# Patient Record
Sex: Female | Born: 1960 | Race: White | Hispanic: No | Smoking: Never smoker
Health system: Southern US, Community
[De-identification: ages and names within clinical notes are randomized; demographics above are authoritative.]

## PROBLEM LIST (undated history)

## (undated) DIAGNOSIS — IMO0001 Reserved for inherently not codable concepts without codable children: Secondary | ICD-10-CM

## (undated) DIAGNOSIS — B009 Herpesviral infection, unspecified: Secondary | ICD-10-CM

## (undated) DIAGNOSIS — E039 Hypothyroidism, unspecified: Secondary | ICD-10-CM

## (undated) DIAGNOSIS — IMO0002 Reserved for concepts with insufficient information to code with codable children: Secondary | ICD-10-CM

## (undated) DIAGNOSIS — F329 Major depressive disorder, single episode, unspecified: Secondary | ICD-10-CM

## (undated) DIAGNOSIS — F419 Anxiety disorder, unspecified: Secondary | ICD-10-CM

## (undated) DIAGNOSIS — I341 Nonrheumatic mitral (valve) prolapse: Secondary | ICD-10-CM

## (undated) DIAGNOSIS — Z8669 Personal history of other diseases of the nervous system and sense organs: Secondary | ICD-10-CM

## (undated) DIAGNOSIS — M1990 Unspecified osteoarthritis, unspecified site: Secondary | ICD-10-CM

## (undated) DIAGNOSIS — F32A Depression, unspecified: Secondary | ICD-10-CM

## (undated) DIAGNOSIS — Z87898 Personal history of other specified conditions: Secondary | ICD-10-CM

## (undated) HISTORY — DX: Personal history of other specified conditions: Z87.898

## (undated) HISTORY — DX: Unspecified osteoarthritis, unspecified site: M19.90

## (undated) HISTORY — DX: Reserved for inherently not codable concepts without codable children: IMO0001

## (undated) HISTORY — DX: Reserved for concepts with insufficient information to code with codable children: IMO0002

## (undated) HISTORY — PX: AUGMENTATION MAMMAPLASTY: SUR837

## (undated) HISTORY — DX: Major depressive disorder, single episode, unspecified: F32.9

## (undated) HISTORY — PX: BREAST EXCISIONAL BIOPSY: SUR124

## (undated) HISTORY — DX: Anxiety disorder, unspecified: F41.9

## (undated) HISTORY — DX: Herpesviral infection, unspecified: B00.9

## (undated) HISTORY — DX: Nonrheumatic mitral (valve) prolapse: I34.1

## (undated) HISTORY — DX: Personal history of other diseases of the nervous system and sense organs: Z86.69

## (undated) HISTORY — DX: Depression, unspecified: F32.A

## (undated) HISTORY — PX: WISDOM TOOTH EXTRACTION: SHX21

## (undated) HISTORY — DX: Hypothyroidism, unspecified: E03.9

---

## 1991-12-04 HISTORY — PX: BREAST ENHANCEMENT SURGERY: SHX7

## 1999-12-04 HISTORY — PX: OTHER SURGICAL HISTORY: SHX169

## 2003-03-18 ENCOUNTER — Other Ambulatory Visit: Admission: RE | Admit: 2003-03-18 | Discharge: 2003-03-18 | Payer: Self-pay | Admitting: Internal Medicine

## 2003-11-13 ENCOUNTER — Emergency Department (HOSPITAL_COMMUNITY): Admission: EM | Admit: 2003-11-13 | Discharge: 2003-11-14 | Payer: Self-pay | Admitting: Emergency Medicine

## 2004-04-20 ENCOUNTER — Other Ambulatory Visit: Admission: RE | Admit: 2004-04-20 | Discharge: 2004-04-20 | Payer: Self-pay | Admitting: Obstetrics and Gynecology

## 2004-04-27 ENCOUNTER — Ambulatory Visit (HOSPITAL_COMMUNITY): Admission: RE | Admit: 2004-04-27 | Discharge: 2004-04-27 | Payer: Self-pay | Admitting: *Deleted

## 2004-12-03 HISTORY — PX: OTHER SURGICAL HISTORY: SHX169

## 2005-02-16 ENCOUNTER — Emergency Department (HOSPITAL_COMMUNITY): Admission: EM | Admit: 2005-02-16 | Discharge: 2005-02-16 | Payer: Self-pay | Admitting: Emergency Medicine

## 2005-04-24 ENCOUNTER — Other Ambulatory Visit: Admission: RE | Admit: 2005-04-24 | Discharge: 2005-04-24 | Payer: Self-pay | Admitting: *Deleted

## 2005-08-31 ENCOUNTER — Encounter: Admission: RE | Admit: 2005-08-31 | Discharge: 2005-08-31 | Payer: Self-pay | Admitting: *Deleted

## 2006-02-14 ENCOUNTER — Emergency Department (HOSPITAL_COMMUNITY): Admission: EM | Admit: 2006-02-14 | Discharge: 2006-02-14 | Payer: Self-pay | Admitting: Emergency Medicine

## 2006-02-25 ENCOUNTER — Emergency Department (HOSPITAL_COMMUNITY): Admission: EM | Admit: 2006-02-25 | Discharge: 2006-02-25 | Payer: Self-pay | Admitting: Emergency Medicine

## 2006-05-21 ENCOUNTER — Emergency Department (HOSPITAL_COMMUNITY): Admission: EM | Admit: 2006-05-21 | Discharge: 2006-05-21 | Payer: Self-pay | Admitting: Emergency Medicine

## 2006-08-15 ENCOUNTER — Encounter: Admission: RE | Admit: 2006-08-15 | Discharge: 2006-08-15 | Payer: Self-pay | Admitting: Obstetrics & Gynecology

## 2007-02-12 ENCOUNTER — Other Ambulatory Visit: Admission: RE | Admit: 2007-02-12 | Discharge: 2007-02-12 | Payer: Self-pay | Admitting: Obstetrics & Gynecology

## 2007-02-25 ENCOUNTER — Encounter: Admission: RE | Admit: 2007-02-25 | Discharge: 2007-02-25 | Payer: Self-pay | Admitting: Obstetrics and Gynecology

## 2007-06-03 HISTORY — PX: OTHER SURGICAL HISTORY: SHX169

## 2007-06-10 ENCOUNTER — Ambulatory Visit (HOSPITAL_BASED_OUTPATIENT_CLINIC_OR_DEPARTMENT_OTHER): Admission: RE | Admit: 2007-06-10 | Discharge: 2007-06-10 | Payer: Self-pay | Admitting: Obstetrics & Gynecology

## 2007-12-31 ENCOUNTER — Encounter: Admission: RE | Admit: 2007-12-31 | Discharge: 2007-12-31 | Payer: Self-pay | Admitting: Family Medicine

## 2008-04-08 ENCOUNTER — Other Ambulatory Visit: Admission: RE | Admit: 2008-04-08 | Discharge: 2008-04-08 | Payer: Self-pay | Admitting: Obstetrics and Gynecology

## 2008-05-13 ENCOUNTER — Encounter: Admission: RE | Admit: 2008-05-13 | Discharge: 2008-05-13 | Payer: Self-pay | Admitting: Obstetrics & Gynecology

## 2008-07-09 ENCOUNTER — Encounter: Admission: RE | Admit: 2008-07-09 | Discharge: 2008-07-09 | Payer: Self-pay | Admitting: Family Medicine

## 2008-07-30 ENCOUNTER — Encounter: Admission: RE | Admit: 2008-07-30 | Discharge: 2008-07-30 | Payer: Self-pay | Admitting: Family Medicine

## 2008-08-10 ENCOUNTER — Encounter: Admission: RE | Admit: 2008-08-10 | Discharge: 2008-09-01 | Payer: Self-pay | Admitting: Family Medicine

## 2008-08-25 ENCOUNTER — Encounter: Admission: RE | Admit: 2008-08-25 | Discharge: 2008-08-25 | Payer: Self-pay | Admitting: General Surgery

## 2009-03-11 ENCOUNTER — Encounter
Admission: RE | Admit: 2009-03-11 | Discharge: 2009-03-16 | Payer: Self-pay | Admitting: Physical Medicine & Rehabilitation

## 2009-03-16 ENCOUNTER — Ambulatory Visit: Payer: Self-pay | Admitting: Physical Medicine & Rehabilitation

## 2009-04-02 DIAGNOSIS — IMO0002 Reserved for concepts with insufficient information to code with codable children: Secondary | ICD-10-CM

## 2009-04-02 DIAGNOSIS — R87619 Unspecified abnormal cytological findings in specimens from cervix uteri: Secondary | ICD-10-CM

## 2009-04-02 HISTORY — DX: Unspecified abnormal cytological findings in specimens from cervix uteri: R87.619

## 2009-04-02 HISTORY — DX: Reserved for concepts with insufficient information to code with codable children: IMO0002

## 2009-04-12 ENCOUNTER — Other Ambulatory Visit: Admission: RE | Admit: 2009-04-12 | Discharge: 2009-04-12 | Payer: Self-pay | Admitting: Obstetrics and Gynecology

## 2009-04-28 ENCOUNTER — Ambulatory Visit: Payer: Self-pay | Admitting: Physical Medicine & Rehabilitation

## 2009-04-28 ENCOUNTER — Encounter
Admission: RE | Admit: 2009-04-28 | Discharge: 2009-07-27 | Payer: Self-pay | Admitting: Physical Medicine & Rehabilitation

## 2009-05-03 ENCOUNTER — Ambulatory Visit: Payer: Self-pay | Admitting: Physical Medicine & Rehabilitation

## 2009-05-03 ENCOUNTER — Encounter
Admission: RE | Admit: 2009-05-03 | Discharge: 2009-08-01 | Payer: Self-pay | Admitting: Physical Medicine & Rehabilitation

## 2009-05-16 ENCOUNTER — Encounter: Admission: RE | Admit: 2009-05-16 | Discharge: 2009-05-16 | Payer: Self-pay | Admitting: Obstetrics & Gynecology

## 2009-06-13 ENCOUNTER — Ambulatory Visit: Payer: Self-pay | Admitting: Physical Medicine & Rehabilitation

## 2009-06-23 ENCOUNTER — Ambulatory Visit: Payer: Self-pay | Admitting: Physical Medicine & Rehabilitation

## 2009-08-12 ENCOUNTER — Encounter
Admission: RE | Admit: 2009-08-12 | Discharge: 2009-11-07 | Payer: Self-pay | Admitting: Physical Medicine & Rehabilitation

## 2009-09-13 ENCOUNTER — Ambulatory Visit: Payer: Self-pay | Admitting: Physical Medicine & Rehabilitation

## 2009-11-07 ENCOUNTER — Encounter
Admission: RE | Admit: 2009-11-07 | Discharge: 2009-11-24 | Payer: Self-pay | Admitting: Physical Medicine & Rehabilitation

## 2009-11-08 ENCOUNTER — Ambulatory Visit: Payer: Self-pay | Admitting: Physical Medicine & Rehabilitation

## 2009-12-09 ENCOUNTER — Encounter
Admission: RE | Admit: 2009-12-09 | Discharge: 2010-03-09 | Payer: Self-pay | Admitting: Physical Medicine & Rehabilitation

## 2010-01-17 ENCOUNTER — Ambulatory Visit: Payer: Self-pay | Admitting: Physical Medicine & Rehabilitation

## 2010-03-10 ENCOUNTER — Encounter
Admission: RE | Admit: 2010-03-10 | Discharge: 2010-05-09 | Payer: Self-pay | Source: Home / Self Care | Admitting: Physical Medicine & Rehabilitation

## 2010-03-14 ENCOUNTER — Ambulatory Visit: Payer: Self-pay | Admitting: Physical Medicine & Rehabilitation

## 2010-05-09 ENCOUNTER — Ambulatory Visit: Payer: Self-pay | Admitting: Physical Medicine & Rehabilitation

## 2010-06-20 ENCOUNTER — Encounter: Admission: RE | Admit: 2010-06-20 | Discharge: 2010-06-20 | Payer: Self-pay | Admitting: Obstetrics & Gynecology

## 2010-06-23 ENCOUNTER — Encounter: Admission: RE | Admit: 2010-06-23 | Discharge: 2010-06-23 | Payer: Self-pay | Admitting: Obstetrics & Gynecology

## 2010-07-03 ENCOUNTER — Encounter
Admission: RE | Admit: 2010-07-03 | Discharge: 2010-07-10 | Payer: Self-pay | Admitting: Physical Medicine & Rehabilitation

## 2010-07-10 ENCOUNTER — Ambulatory Visit: Payer: Self-pay | Admitting: Physical Medicine & Rehabilitation

## 2010-09-04 ENCOUNTER — Encounter
Admission: RE | Admit: 2010-09-04 | Discharge: 2010-09-04 | Payer: Self-pay | Source: Home / Self Care | Attending: Physical Medicine & Rehabilitation | Admitting: Physical Medicine & Rehabilitation

## 2010-09-04 ENCOUNTER — Ambulatory Visit: Payer: Self-pay | Admitting: Physical Medicine & Rehabilitation

## 2010-12-04 ENCOUNTER — Encounter
Admission: RE | Admit: 2010-12-04 | Discharge: 2011-01-02 | Payer: Self-pay | Source: Home / Self Care | Attending: Physical Medicine & Rehabilitation | Admitting: Physical Medicine & Rehabilitation

## 2010-12-24 ENCOUNTER — Encounter: Payer: Self-pay | Admitting: Obstetrics & Gynecology

## 2010-12-25 ENCOUNTER — Encounter: Payer: Self-pay | Admitting: General Practice

## 2011-01-03 ENCOUNTER — Ambulatory Visit: Admit: 2011-01-03 | Payer: Self-pay | Admitting: Physical Medicine & Rehabilitation

## 2011-01-03 ENCOUNTER — Ambulatory Visit (HOSPITAL_BASED_OUTPATIENT_CLINIC_OR_DEPARTMENT_OTHER): Payer: BC Managed Care – PPO | Admitting: Physical Medicine & Rehabilitation

## 2011-01-03 ENCOUNTER — Ambulatory Visit: Payer: BC Managed Care – PPO | Attending: Physical Medicine & Rehabilitation

## 2011-01-03 DIAGNOSIS — M47817 Spondylosis without myelopathy or radiculopathy, lumbosacral region: Secondary | ICD-10-CM | POA: Insufficient documentation

## 2011-01-03 DIAGNOSIS — M2170 Unequal limb length (acquired), unspecified site: Secondary | ICD-10-CM | POA: Insufficient documentation

## 2011-01-03 DIAGNOSIS — Z79899 Other long term (current) drug therapy: Secondary | ICD-10-CM | POA: Insufficient documentation

## 2011-01-03 DIAGNOSIS — M5380 Other specified dorsopathies, site unspecified: Secondary | ICD-10-CM

## 2011-01-03 DIAGNOSIS — F341 Dysthymic disorder: Secondary | ICD-10-CM

## 2011-01-03 DIAGNOSIS — M5137 Other intervertebral disc degeneration, lumbosacral region: Secondary | ICD-10-CM

## 2011-02-12 ENCOUNTER — Ambulatory Visit: Payer: Self-pay | Admitting: Physical Medicine & Rehabilitation

## 2011-04-17 NOTE — Assessment & Plan Note (Signed)
Nancy Glenn is back regarding her back pain.  She had some relief with the  prednisone again, but gained weight.  We started Lidoderm patches and  resumed therapy.  She also reports numbness in her hands related to her  typing on the computer and occasionally she has woken up at night with  numb hands.  Rates her pain 8/10, described as constant and stabbing.  Pain interferes with general activity, relations with others, enjoyment  of life on a severe level.  Sleep is fair.   REVIEW OF SYSTEMS:  Notable for numbness.  Other pertinent positives are  above and full review is in the written health and history section of  the chart.   SOCIAL HISTORY:  The patient is divorced and has 2 college-age  daughters.   PHYSICAL EXAMINATION:  Blood pressure is 116/62, pulse is 92,  respiratory rate is 18.  She is sating 99% on room air.  The patient is  pleasant, alert, and oriented x3.  Weight is stable.  She continues to  have some pain over the mid to lower lumbar spine.  Seem to be worse  with lateral bending on either side.  She is able to touch her toes and  then had pain with extension back to a neutral position.  Frank facet  maneuvers were not as painful today.  Strength is 5/5.  Normal sensory  exam.  Palpation revealed tenderness along the lateral processes and  facets in the lower lumbar spine.  Heart is regular.  Chest is clear.  Abdomen is soft, nontender.   ASSESSMENT:  1. Persistent low back pain.  The patient with documented L4-L5      herniated disk with only minimal results after epidural injection.  2. Attention deficit hyperactivity disorder.  3. Potential facet-mediated pain.   PLAN:  1. I think it would be worthwhile to try medial branch blocks      bilaterally at L3-L4, L4-L5 to see what component of her pain is      related to this.  2. We will stop Flexeril, introduce Skelaxin 800 mg q.6 h. p.r.n.  3. Introduce Mobic 15 mg q.a.m.  We discussed effects and side effects   of medications today.  4. The patient needs to try to push and resume exercise as possible      going forward.  I often feel that her complaints without of      proportion to her physical exam quite frankly.  Anxiety seems to      continue to play a big role there.       Ranelle Oyster, M.D.  Electronically Signed     ZTS/MedQ  D:  06/13/2009 11:35:49  T:  06/14/2009 00:54:34  Job #:  [id number]

## 2011-04-17 NOTE — Procedures (Signed)
Nancy Glenn, MERGENTHALER               ACCOUNT NO.:  [id number]   MEDICAL RECORD NO.:  [id number]           PATIENT TYPE:   LOCATION:                                 FACILITY:   PHYSICIAN:  Erick Colace, M.D.DATE OF BIRTH:  04-27-1961   DATE OF PROCEDURE:  06/23/2009  DATE OF DISCHARGE:                               OPERATIVE REPORT   Ms. Clowdus is a 50 year old female with pain, mainly activity related in  her low back area.  The pain is only partial response to medication  management and other conservative care.  Her pain with activity is 7/10  and interferes with prolonged sitting or bending.  Informed consent was  obtained after describing the risks and benefits of the procedure with  the patient.  These include bleeding, bruising, infection, temporary,  and permanent paralysis.  She elects to proceed and has given written  consent.  The patient was placed prone on fluoroscopy table.  Betadine  prep, sterile drape, 25-gauge 1-1/2-inch needle was used to anesthetize  skin and subcu tissue with 1% lidocaine x2 mL.  Then, a 22-gauge 3-1/2-  inch spinal needle was inserted, first targeting left S1 SAP sacral ala  junction, bone contact made, and confirmed with lateral imaging.  Omnipaque 180 x 0.5 mL demonstrated no intravascular uptake.  Then 0.5  mL of solution containing 1 mL of 4 mg/mL dexamethasone and 2 mL of 2%  MPF lidocaine were injected.  Then, left L5 SAP transverse process  junction, targeted bone contact made.  Omnipaque 180 x 0.5 mL  demonstrated no intravascular uptake.  Then, 0.5 mL of dexamethasone  lidocaine solution was injected.  The left L4 SAP transverse process  junction, targeted bone contact made.  Omnipaque 180 under live fluoro  demonstrated no intravascular uptake.  Then 0.5 of the dexamethasone  lidocaine solution was injected.  Same procedure repeated on the right  side using same needle injectate and technique.  The patient tolerated  the procedure  well.  Pre- and post- injection vitals stable.  Post  injection instructions given.      Erick Colace, M.D.  Electronically Signed     AEK/MEDQ  D:  06/23/2009 09:38:29  T:  06/23/2009 23:59:48  Job:  [id number]

## 2011-04-17 NOTE — Procedures (Signed)
Nancy Glenn, Nancy Glenn               ACCOUNT NO.:  [id number]   MEDICAL RECORD NO.:  [id number]          PATIENT TYPE:  SPE   LOCATION:  [facility]                         FACILITY:  [facility]   PHYSICIAN:  Erick Colace, M.D.DATE OF BIRTH:  June 19, 1961   DATE OF PROCEDURE:  04/28/2009  DATE OF DISCHARGE:  04/12/2009                               OPERATIVE REPORT   PROCEDURE:  Left paramedian L4-5 translaminar lumbar epidural steroid  injection under fluoroscopic guidance.   INDICATION:  Lumbar radiculitis, left lower extremity greater than right  lower extremity, pain is only partially responsive to medication  management including narcotic analgesics and interfering with mobility.   Informed consent was obtained after describing risks and benefits of the  procedure with the patient.  These include bleeding, bruising, and  infection.  She elects to proceed and has given written consent.  The  patient was placed prone on fluoroscopy table.  Betadine prep, sterile  drape.  A 25-gauge 1-1/2-inch needle was used to anesthetize the skin  and subcu tissue with 1% lidocaine x4 mL into the subcutaneous and  intramuscular tissues.  Then a 18-gauge Tuohy needle was inserted under  fluoroscopic guidance, targeting the left paramedian trans-interlaminar  space L4-L5.  AP and lateral images were utilized under lateral imaging.  Loss-of-resistance technique was employed.  Positive loss-of-resistance  confirmed with live epidural injection of Omnipaque 180 x 2 mL,  demonstrated good epidural spread, followed by injection of 2 mL of 40  mg/mL Depo-Medrol and 2 mL of 1% MPF lidocaine.  The patient tolerated  the procedure well.  Pre -and post-injection vitals were stable.  Post-  injection instructions was given.      Erick Colace, M.D.  Electronically Signed     AEK/MEDQ  D:  04/28/2009 10:40:23  T:  04/29/2009 00:46:44  Job:  [id number]

## 2011-04-17 NOTE — Group Therapy Note (Signed)
Nancy Glenn is visiting [place] today with complaints of low back pain.  She  referred to [place] by Dr. Louanna Raw at Urgent Care.   HISTORY OF PRESENT ILLNESS:  This is a 50 year old white female with  really no past medical history who states that since July 2009, she has  had increasing low back pain and really over the last year or so, it has  become unbearable.  She has MRIs of her neck and back done in January of  last year, which are notable for spondylosis in lower cervical spine.  She has multilevel mild disk disease in the lumbar spine; however, at L4-  L5, there was a more pronounced central disk herniation that attended  the thecal sac.  No definite nerve root compression was demonstrated.  Apparently, she had these tests done, but no one ever frankly discussed  the results with her at least by her account.  She has MRI of her brain,  also from August 2009, which shows scattered subcentimeter foci of  increased signal in the subcortical white matter.  The patient's pain  was 8/10.  She described as a sharp, stabbing, constant aching.  Pain  really only resides in her low back, does not radiate into the legs or  feet at all.  It is worse with prolonged activity of any sort including  walking, bending, sitting in activity sometimes two.  She notes that if  she sits for long period of time, it is often difficult to stand backup  and if she is standing for a while it is often helpful to put her elbows  on the table for support.  She use some heat with PT, which helps  somewhat.  She has been seeing them from around a month.  She receives a  TENS unit prescribed by her family physician and this has provided some  relief as well.  She also reports history of migraine headaches, which  have not been too far off her norm.  If anything in fact maybe  improving.   MEDICATIONS:  The patient is taking multiple medications recently  including Phenergan 25, Demerol 50, etodolac 400, Soma 350,  hydrocodone  10/500, oxycodone prescribed by physician assistant, Chestine Spore.  She reports  only taking the hydrocodone recently and the other one has been more  remote meds and/or she is seizure went out of them.  She reports some  benefit with all these to a certain extent, but nothing in a substantial  way.   ALLERGIES:  SULFA.   REVIEW OF SYSTEMS:  Notable for spasms in the back, as well as  shoulders.  She reports some anxiety as well.  She reports decreased  activity in general.   SOCIAL HISTORY:  The patient is divorced, but has the youngest child  still at home.  She had been previously very active, working out in [employer], etc. until her back started acting up severely over the last few  months time.   FAMILY HISTORY:  Unremarkable.   PHYSICAL EXAMINATION:  VITAL SIGNS:  Blood pressure is 122/66, pulse is  96, respiratory rate 18.  She is sating 99% on room air.  The patient is  generally pleasant.  She is a bit anxious and when I walked in the room,  she was pacing the room.  Affect is generally appropriate otherwise.  Gait was stable.  Her coordination was good overall.  Reflexes are 2+.  Sensation was grossly intact.  Back was notable for  slight elevation of  the right hemipelvis and counterclockwise rotation.  She had tenderness  along the lumbar paraspinals approximately at L3 through L5.  Pain was  notable with flexion as I can only bend her to about 40 degrees before  pain set in.  She also had pain with extension and facet maneuvers right  more than left today.  Lateral bending cause pain either side as well as  rotation to a lesser extent.  Strength is 5/5 in both legs and normal  sensory function.  Straight leg testing was equivocal.  Luisa Hart test and  compression tests were negative.  I did measure her leg length and she  measured out approximately 0.25-inch longer on the right than the left.  No obvious scoliosis was seen.  Neurologically she was intact otherwise   with good insight, awareness, memory, etc.  HEART:  Regular.  CHEST:  Clear.  ABDOMEN:  Soft, nontender.  The patient is rather tall and appear to be fairly physical physically  fit.   ASSESSMENT:  1. Chronic low back pain.  Etiology appears to be more a diskogenic if      we go by her MRI, although exam is consistent with facet mediated      process versus myofascial process.  2. Anxiety.   PLAN:  1. We started on a prednisone taper beginning at 60 mg in tapering off      over approximately 2 weeks time.  2. We will begin scheduled Skelaxin 800 mg q.6 h to tolerance.  3. We will be refill oxycodone 10/325 one q.6 h p.r.n. #60 for now.  4. Urine drug screen was collected today.  5. We will send her for an MRI of the lumbar spine to reassess her      back.  Certainly, symptoms increased dramatically since her      incident in October.  I would favor pursuing injection at this      point, but would like to defer on that until we receive her MRI      report.  I think with appropriate treatment, we can reduce her pain      and get her back to her level of activity she is more accustomed      to.      Ranelle Oyster, M.D.  Electronically Signed     ZTS/MedQ  D:  03/16/2009 15:00:08  T:  03/17/2009 06:58:57  Job #:  [id number]   cc:   Louanna Raw  Fax: [phone]

## 2011-04-17 NOTE — Op Note (Signed)
NAMEHALEEMAH, Nancy Glenn               ACCOUNT NO.:  [id number]   MEDICAL RECORD NO.:  [id number]          PATIENT TYPE:  AMB   LOCATION:  [facility]                         FACILITY:  [facility]   PHYSICIAN:  M. Leda Quail, MD  DATE OF BIRTH:  1961/04/20   DATE OF PROCEDURE:  06/10/2007  DATE OF DISCHARGE:                               OPERATIVE REPORT   PREOPERATIVE DIAGNOSES:  5. A 50 year old, G 3, P 3 white female, with labial asymmetry and      labial hypertrophy, desires a labial reduction.  2. Vulvar irritation due to the labial asymmetry and hypertrophy.  3. Desirous of more long-term birth control, particularly Marina      intrauterine device.  4. History of mitral valve prolapse.   POSTOPERATIVE DIAGNOSES:  73. A 50 year old, G 3, P 3 white female, with labial asymmetry and      labial hypertrophy, desires a labial reduction.  2. Vulvar irritation due to the labial asymmetry and hypertrophy.  3. Desirous of more long-term birth control, particularly Marina      intrauterine device.  4. History of mitral valve prolapse.   PROCEDURE:  1. Marina intrauterine device placement.  2. Labial reduction.   SURGEON:  M. Leda Quail, M.D.   ASSISTANT:  Operating room staff.   ANESTHESIA:  MAC anesthesia.   SPECIMENS:  None.   ESTIMATED BLOOD LOSS:  Minimal.   FLUIDS:  Was 1000 mL of LR.   URINE OUTPUT:  The patient voided right before going into the operating  room.   COMPLICATIONS:  None.   INDICATIONS FOR PROCEDURE:  Nancy Glenn is a pleasant 50 year old G 3, P  3 divorced white female who does have labial asymmetry and some  hypertrophy.  The asymmetry is primarily from childbearing, as she  obviously has lost a piece of her labia from her right side.  The  patient does have a lot of vulvar irritation, particularly when she is  exercising, because of the asymmetry and the way that the left labia  moves with  exercise.  She is desirous of having this more symmetric  looking.  She and I have discussed this multiple times in the office and  she understands that although she is having some discomfort with this,  that we are doing this primarily as a cosmetic procedure.  The patient  is also in a more long-term relationship and is desirous of an IUD  placement.  She is divorced but will be monogamous with her current  partner.  I do feel that in this patient an IUD is an appropriate form  of birth control.  We have discussed its risks as well.  She understands  that there is a small percentage of failure, less than 1%, and a risk of  ectopic pregnancy if she does get pregnant in the future.  She also is  aware that if she were ever to develop any vaginal infections like  gonorrhea or Chlamydia, the IUD would need to be removed and she would  need to be on a several-week course of antibiotics for prevention of  PID.  She has been counseled about other forms of birth control and has  decided to proceed with the [facility].   DESCRIPTION OF PROCEDURE:  The patient is taken to the operating room.  She is placed in the supine position.  Anesthesia is administered by the  anesthesia team without difficulty.  The legs are positioned in the  lithotomy position in the [place] stirrups.  The perineum and vagina are  prepped and draped in a normal sterile fashion.  The patient voided  right before going in to the operating room, so the bladder was not  drained.   The labia are marked, as we discussed in the clinic, with the marking  pen.  The speculum was then placed in the vagina.  The anterior lip of  the cervix was grasped.  The uterus was sounded to 8.5 cm.  The IUD is  placed to the fundus of the uterus with the introducer.  The IUD is  released in the cavity for five seconds and then the IUD is fully  removed from the introducer.  The introducer is removed from the cervix  and the string is cut to about 2.5 cm.  The tenaculum is removed from  the cervix and the  speculum is removed from the vagina.   Then using the Metzenbaum scissors, the labia are excised at the edge of  where the marking pen lines were drawn.  This was done after injecting  about 4 mL of 1% lidocaine with 2.5 mL being on the left side and about  1.5 mL being on the right side.  The incisions were then closed with a  subcuticular stitch of #4-0 Monocryl.  At this point the labia are  fairly symmetric, except that there is a little bit more lidocaine on  the left side, making the left side a little bit puffier.  At this  point, I do not want to do any more tissue excision.  There may be a  small area down the inferior aspect of the left vaginal opening.  If the  patient feels uncomfortable with this, I will be glad to numb this and  trim this out in the clinic.   The sponge, lap, needle and instrument counts are correct x2.  The  patient tolerated the procedure well.  She is awakened from anesthesia  and taken to the recovery room in stable condition.      Lum Keas, MD  Electronically Signed     MSM/MEDQ  D:  06/10/2007  T:  06/10/2007  Job:  [id number]

## 2011-04-17 NOTE — Assessment & Plan Note (Signed)
Nancy Glenn is back regarding her back pain.  She saw Dr. Wynn Banker a week  ago for the L4-L5 epidural and she had an hour of relief and then after  that no further benefit.  She talked about her Adderall use and had been  prescribed it twice a day, but she uses it once a day p.r.n. at home  particularly when days she needs to focus better.  She had lot of  anxiety at home with her daughters, seems like psychosocial issues,  which have been hanging over her head.  Describes pain as burning,  stabbing, dull, and aching.  Pain ranges from 6-8/10, is in the low back  region without radiation.  It seems to bother left side bit more than  right, but tends to change as well.  She felt that she had benefit with  the prednisone taper we had written 6 weeks ago, but after it had run  out, she felt that pain came back on.   REVIEW OF SYSTEMS:  Notable for anxiety and night sweats.  Other  pertinent positives are as above, and full review is in the written  health and history section of the chart.   SOCIAL HISTORY:  Unchanged.  The patient is divorced and living with her  daughters.   PHYSICAL EXAMINATION:  Blood pressure 114/51, pulse 85, and respiratory  rate 16.  She is saturating 98% on room air.  The patient is pleasant,  alert and oriented x3.  Affect is bright and appropriate.  She seemed  less anxious today, but still has some difficulty staying to task.  Gait  was stable.  She had some pain with palpation over the area of the  longissimus muscle particularly at L1-L2 level on the left more than  right.  She also had pain L4-L5 level, once again with flexion and  extension.  Extension and facet maneuver seemed to be more reproducing  of her pain complaints, however.  Strength is 5/5 with sensory function  intact.  The patient was able almost to touch her toes today on  examination.  Heart was regular.  Chest is clear.  Abdomen is soft and  nontender.   ASSESSMENT:  1. Persistent low back  pain.  The patient has a herniated disk at L4-      L5 which appears radiographically mild, and she had no response      whatsoever to the epidural injection.  I am favoring more facet      causes for her low back pain.  2. Upper lumbar spine pain, which may be more myofascial in nature.  3. Attention deficit hyperactivity disorder and anxiety disorder.   PLAN:  1. We will retry the prednisone as she felt she did very well with      that and no psychological repercussions.  We will titrate down over      essentially for 20 days time.  2. I will refill the Percocet, which she can get filled later this      month.  I want her to try to stay to on an average 2 tablets a day      with 10/325 dose.  3. Introduce Lidoderm patches 2 daily to the back.  She was instructed      on the placement of these today.  4. Consider lumbar facet blocks.  5. Encouraged resumption of low intensity aerobic therapy.  She can      try her elliptical at low resistance for 5-10  minutes and slowly      work her way up.  I would like her to also use stretching, ice/heat      with her exercise regimen.  6. Follow up with Psychiatry regarding medications for her mood.  I      think she would do well with the daily      Adderall dose.  7. I will see her back in about 1 months' time.      Ranelle Oyster, M.D.  Electronically Signed     ZTS/MedQ  D:  05/03/2009 11:26:30  T:  05/04/2009 00:44:43  Job #:  [id number]   cc:   Louanna Raw  Fax: [phone]

## 2011-07-16 ENCOUNTER — Other Ambulatory Visit: Payer: Self-pay | Admitting: Obstetrics & Gynecology

## 2011-07-16 DIAGNOSIS — Z1231 Encounter for screening mammogram for malignant neoplasm of breast: Secondary | ICD-10-CM

## 2011-07-17 ENCOUNTER — Other Ambulatory Visit: Payer: Self-pay | Admitting: Obstetrics & Gynecology

## 2011-07-17 DIAGNOSIS — Z1231 Encounter for screening mammogram for malignant neoplasm of breast: Secondary | ICD-10-CM

## 2011-07-18 ENCOUNTER — Encounter
Payer: BC Managed Care – PPO | Attending: Physical Medicine & Rehabilitation | Admitting: Physical Medicine & Rehabilitation

## 2011-07-18 DIAGNOSIS — M545 Low back pain, unspecified: Secondary | ICD-10-CM | POA: Insufficient documentation

## 2011-07-18 DIAGNOSIS — M2170 Unequal limb length (acquired), unspecified site: Secondary | ICD-10-CM | POA: Insufficient documentation

## 2011-07-18 DIAGNOSIS — M5137 Other intervertebral disc degeneration, lumbosacral region: Secondary | ICD-10-CM

## 2011-07-18 DIAGNOSIS — F341 Dysthymic disorder: Secondary | ICD-10-CM

## 2011-07-18 DIAGNOSIS — M5380 Other specified dorsopathies, site unspecified: Secondary | ICD-10-CM

## 2011-07-18 DIAGNOSIS — M129 Arthropathy, unspecified: Secondary | ICD-10-CM | POA: Insufficient documentation

## 2011-07-18 NOTE — Assessment & Plan Note (Signed)
HISTORY:  Nancy Glenn is back regarding her back pain.  Pain is generally the same range.  She is doing a bit better at rest.  She got a new bed as well as elevate the feed and gives her more supported and her sleep is much better.  Still has pain in the mornings and sometimes at the end of the day with her activities.  Her tilt table helps as well as stretching but the Effexor not long lasting.  She tells me she is going back to work and is somewhat excited about this, but afraid of some the effects of being at a desk and how this will affect her back ultimately.  She does have the shoe buildup on the left side to accommodate for the left leg length discrepancy.  REVIEW OF SYSTEMS:  Notable for depression.  Other pertinent positives are above and full 12-point review is in the written health and history section.  SOCIAL HISTORY:  The patient is divorced and had no changes noted.  PHYSICAL EXAMINATION:  VITAL SIGNS:  Blood pressure 104/72, pulse 80, respiratory rate 18, and she is satting 98% on room air. GENERAL:  The patient is pleasant, alert, oriented x3.  Affect showing bright and appropriate. MUSCULOSKELETAL:  With her shoe buildup, she is balanced at the pelvis. These have some pain in the middle to upper lumbar spine right greater than left.  Facet maneuvers were positive.  She has some clockwise rotation of the lumbar spine with some spasm of the paraspinals in that region.  Strength remains 5/5, normal sensory, and reflexes are noted. Cognitively, she is alert and appropriate.  ASSESSMENT:  Multifactorial low back pain due to left leg length discrepancy as well as facet arthropathy and postural issues.  PLAN: 1. I recommended Pilates based therapy at [facility].  I think     ultimately we need to work on the causes for some of her     musculoskeletal and postural inbalances in the lower back and the     forces upon her facets. 2. She does not want to pursue facet blocks and  that is fine for now. 3. I refilled Percocet #90.  She is generally using about 2-3 a day.     More often at night 2. 4. Chiropractor followup may be appropriate as well but I think in     this case really need to work on a daily posture positioning and     everyday habits and techniques.     Ranelle Oyster, M.D. Electronically Signed    ZTS/MedQ D:  07/18/2011 12:36:03  T:  07/18/2011 15:26:13  Job #:  [id number]

## 2011-07-24 ENCOUNTER — Ambulatory Visit (HOSPITAL_COMMUNITY): Payer: BC Managed Care – PPO

## 2011-07-25 ENCOUNTER — Other Ambulatory Visit: Payer: Self-pay | Admitting: Obstetrics & Gynecology

## 2011-07-25 ENCOUNTER — Ambulatory Visit
Admission: RE | Admit: 2011-07-25 | Discharge: 2011-07-25 | Disposition: A | Discharge status: Home / Self Care | Payer: BC Managed Care – PPO | Source: Ambulatory Visit | Attending: Obstetrics & Gynecology | Admitting: Obstetrics & Gynecology

## 2011-07-25 DIAGNOSIS — R928 Other abnormal and inconclusive findings on diagnostic imaging of breast: Secondary | ICD-10-CM

## 2011-07-25 DIAGNOSIS — Z1231 Encounter for screening mammogram for malignant neoplasm of breast: Secondary | ICD-10-CM

## 2011-08-02 ENCOUNTER — Other Ambulatory Visit: Payer: Self-pay | Admitting: Obstetrics & Gynecology

## 2011-08-02 ENCOUNTER — Ambulatory Visit
Admission: RE | Admit: 2011-08-02 | Discharge: 2011-08-02 | Disposition: A | Discharge status: Home / Self Care | Payer: BC Managed Care – PPO | Source: Ambulatory Visit | Attending: Obstetrics & Gynecology | Admitting: Obstetrics & Gynecology

## 2011-08-02 DIAGNOSIS — R928 Other abnormal and inconclusive findings on diagnostic imaging of breast: Secondary | ICD-10-CM

## 2011-09-10 ENCOUNTER — Encounter
Payer: BC Managed Care – PPO | Attending: Physical Medicine & Rehabilitation | Admitting: Physical Medicine & Rehabilitation

## 2011-09-10 DIAGNOSIS — M5137 Other intervertebral disc degeneration, lumbosacral region: Secondary | ICD-10-CM

## 2011-09-10 DIAGNOSIS — M545 Low back pain, unspecified: Secondary | ICD-10-CM | POA: Insufficient documentation

## 2011-09-10 DIAGNOSIS — M5380 Other specified dorsopathies, site unspecified: Secondary | ICD-10-CM

## 2011-09-10 DIAGNOSIS — M129 Arthropathy, unspecified: Secondary | ICD-10-CM | POA: Insufficient documentation

## 2011-09-10 DIAGNOSIS — F341 Dysthymic disorder: Secondary | ICD-10-CM

## 2011-09-10 DIAGNOSIS — M51379 Other intervertebral disc degeneration, lumbosacral region without mention of lumbar back pain or lower extremity pain: Secondary | ICD-10-CM | POA: Insufficient documentation

## 2011-09-10 DIAGNOSIS — M2170 Unequal limb length (acquired), unspecified site: Secondary | ICD-10-CM | POA: Insufficient documentation

## 2011-09-11 NOTE — Assessment & Plan Note (Signed)
HISTORY:  Nancy Glenn is back regarding her back pain.  She notes over the last month that her pain increased and seems to be related more laterally.  It bothers her quite a bit when she sits.  She is working now.  Bothers when she sits in a car and it bothers her quite a bit when she has to stand for the first time.  We will address more of her facet related problems as she seems to respond to modalities for this, although responds to stretching, tilt table, etc., seems to be transient.  Last MRI that we performed back in February 2010 showed some bulging at L2-3, L3-4 with a small fissure/tear as well as small central disk extrusion at L4-5.  REVIEW OF SYSTEMS:  Notable for the above.  Full 12-point reviews in the written health and history section of the chart.  SOCIAL HISTORY:  Unchanged.  She is divorced, not working.  She seems to be doing quite well at home.  She does report recently starting some running for about last 2 weeks, although it has been bothering her knees somewhat.  She does not notice change in the back pain.  PHYSICAL EXAMINATION:  VITAL SIGNS:  Blood pressure 107/62, pulse 94, respiratory rate 18, and she is satting 94% on room air. GENERAL:  The patient is pleasant and alert.  She needs some extra time to stand.  She bent and was able to flex to 90 degrees but had discomfort and more discomfort when she moved back to an extension position.  Strength 5/5.  Normal sensory function.  She is wearing high heel shoes and her business clothing.  ASSESSMENT:  Multifactorial low back pain due to leg length discrepancy as well as facet arthropathy, postural issues, and degenerative disk disease.  PLAN: 1. Given that her pain seems to be persistent and increasing if     anything at this point.  I would like to re-image her back.  We     will order an MRI of the lumbar spine without contrast. 2. We will introduce naproxen prescription dose 500 mg q.12 hours with      food. 3. We will stay with her Percocet 10/325 one q.8 hours p.r.n., #90. 4. Discussed continue modality, stretching, etc. 5. I will see her back pending imaging results.     Ranelle Oyster, M.D. Electronically Signed    ZTS/MedQ D:  09/10/2011 10:25:04  T:  09/10/2011 13:51:45  Job #:  [id number]

## 2011-09-12 ENCOUNTER — Other Ambulatory Visit: Payer: Self-pay | Admitting: Physical Medicine & Rehabilitation

## 2011-09-12 DIAGNOSIS — M545 Low back pain: Secondary | ICD-10-CM

## 2011-09-14 ENCOUNTER — Other Ambulatory Visit: Payer: Self-pay

## 2011-09-18 LAB — URINALYSIS, ROUTINE W REFLEX MICROSCOPIC
Glucose, UA: NEGATIVE
Hgb urine dipstick: NEGATIVE
Ketones, ur: NEGATIVE
Protein, ur: NEGATIVE

## 2011-11-13 ENCOUNTER — Ambulatory Visit: Payer: Self-pay | Admitting: Physical Medicine & Rehabilitation

## 2011-12-18 ENCOUNTER — Encounter: Payer: Self-pay | Admitting: Internal Medicine

## 2012-01-10 ENCOUNTER — Ambulatory Visit (AMBULATORY_SURGERY_CENTER): Payer: BC Managed Care – PPO | Admitting: *Deleted

## 2012-01-10 ENCOUNTER — Encounter: Payer: Self-pay | Admitting: Internal Medicine

## 2012-01-10 VITALS — Ht 70.0 in | Wt 149.0 lb

## 2012-01-10 DIAGNOSIS — Z1211 Encounter for screening for malignant neoplasm of colon: Secondary | ICD-10-CM

## 2012-01-10 MED ORDER — PEG-KCL-NACL-NASULF-NA ASC-C 100 G PO SOLR: ORAL | Status: DC

## 2012-01-15 ENCOUNTER — Ambulatory Visit (AMBULATORY_SURGERY_CENTER): Payer: BC Managed Care – PPO | Admitting: Internal Medicine

## 2012-01-15 ENCOUNTER — Encounter: Payer: Self-pay | Admitting: Internal Medicine

## 2012-01-15 VITALS — BP 111/73 | HR 80 | Temp 99.3°F | Resp 20 | Ht 70.0 in | Wt 149.0 lb

## 2012-01-15 DIAGNOSIS — Z1211 Encounter for screening for malignant neoplasm of colon: Secondary | ICD-10-CM

## 2012-01-15 MED ORDER — SODIUM CHLORIDE 0.9 % IV SOLN: 500.0000 mL | INTRAVENOUS | Status: DC

## 2012-01-15 NOTE — Progress Notes (Signed)
Patient did not have preoperative order for IV antibiotic SSI prophylaxis. (G8918) Patient did not have preoperative order for IV antibiotic SSI prophylaxis. (G8918)  

## 2012-01-15 NOTE — Op Note (Signed)
[facility] [address] [place], [place]  [address]  COLONOSCOPY PROCEDURE REPORT  PATIENT:  Nancy Glenn, Nancy Glenn  MR#:  [id number] BIRTHDATE:  12-19-1960, 50 yrs. old  GENDER:  female ENDOSCOPIST:  Carie Caddy. Jakolby Sedivy, MD REF. BY:  Leda Quail, M.D. PROCEDURE DATE:  01/15/2012 PROCEDURE:  Colonoscopy 19147 ASA CLASS:  Class II INDICATIONS:  Elevated Risk Screening (mother with hx of colon cancer), 1st colonoscopy MEDICATIONS:   MAC sedation, administered by CRNA, propofol (Diprivan) 300 mg IV  DESCRIPTION OF PROCEDURE:   After the risks benefits and alternatives of the procedure were thoroughly explained, informed consent was obtained.  Digital rectal exam was performed and revealed no rectal masses.   The [id number] [id number] endoscope was introduced through the anus and advanced to the cecum, which was identified by both the appendix and ileocecal valve, without limitations.  The quality of the prep was Moviprep fair.  The instrument was then slowly withdrawn as the colon was fully examined. <<PROCEDUREIMAGES>>  FINDINGS:  Fair prep requiring copious irrigation and lavage. This was otherwise a normal examination of the colon.  Internal Hemorrhoids were found.   Retroflexed views in the rectum revealed no other findings other than those already described.   The scope was then withdrawn from the cecum and the procedure completed.  COMPLICATIONS:  None  ENDOSCOPIC IMPRESSION: 1) Fair prep, which can limit the detection of small lesions. 2) Otherwise normal examination 3) Internal hemorrhoids  RECOMMENDATIONS: 1) Given your significant family history of colon cancer and the quality of today's preparation, you should have a repeat colonoscopy in 3 years  Carie Caddy. Rhea Belton, MD  CC:  Leda Quail, MD The Patient  n. eSIGNED:   Carie Caddy. Eric Morganti at 01/15/2012 09:30 AM  Nancy Glenn, [phone]

## 2012-01-15 NOTE — Patient Instructions (Signed)
FOLLOW DISCHARGE INSTRUCTIONS (BLUE AND GREEN SHEETS).. 

## 2012-01-16 ENCOUNTER — Telehealth: Payer: Self-pay | Admitting: *Deleted

## 2012-01-16 NOTE — Telephone Encounter (Signed)
Left message to call as needed. 

## 2012-01-22 ENCOUNTER — Other Ambulatory Visit: Payer: Self-pay | Admitting: Internal Medicine

## 2012-01-25 ENCOUNTER — Encounter: Payer: BC Managed Care – PPO | Admitting: Physical Medicine & Rehabilitation

## 2012-01-25 ENCOUNTER — Telehealth: Payer: Self-pay | Admitting: Physical Medicine & Rehabilitation

## 2012-01-25 ENCOUNTER — Telehealth: Payer: Self-pay | Admitting: *Deleted

## 2012-01-25 MED ORDER — OXYCODONE-ACETAMINOPHEN 10-325 MG PO TABS: 1.0000 | ORAL_TABLET | Freq: Three times a day (TID) | ORAL | Status: DC | PRN

## 2012-01-25 NOTE — Telephone Encounter (Signed)
Paityn can be put on RN visit schedule. 3:40 is now open today, or next week.

## 2012-01-25 NOTE — Telephone Encounter (Signed)
Ok to refill. This is the LAST impromptu refill without a visit, especially after a cancellation

## 2012-01-25 NOTE — Telephone Encounter (Signed)
Patient unable to make appt today due to school delays.  Stated she could not leave her kids by themselves.  Would like to p/u script for Oxycontin.

## 2012-01-25 NOTE — Telephone Encounter (Signed)
Pt aware rx is ready for pickup.  

## 2012-01-25 NOTE — Telephone Encounter (Signed)
Cancelled appt for today @ 0920 due to the school delay. Has not been in office since 09/10/11. No MD visits open until mid-March. Not appropriate for RN visit unless OK w/ you.

## 2012-01-25 NOTE — Telephone Encounter (Signed)
Would this be okay with you? Or does she need to come in for RN visit? Last seen on 09/09/12. Last fill on Oxycodone 10/325 1 Q 6hrs prn #90 was on 12/24/11. Please advise. Thanks.

## 2012-01-25 NOTE — Telephone Encounter (Signed)
May see rn next time.

## 2012-01-28 NOTE — Telephone Encounter (Signed)
PATIENT HAS AN APPT IN [facility]. DF

## 2012-02-18 ENCOUNTER — Encounter: Payer: BC Managed Care – PPO | Admitting: Physical Medicine & Rehabilitation

## 2012-02-22 ENCOUNTER — Ambulatory Visit (INDEPENDENT_AMBULATORY_CARE_PROVIDER_SITE_OTHER): Payer: BC Managed Care – PPO | Admitting: Cardiovascular Disease

## 2012-02-22 ENCOUNTER — Encounter: Payer: Self-pay | Admitting: Cardiovascular Disease

## 2012-02-22 VITALS — BP 104/62 | HR 84 | Ht 70.0 in | Wt 149.8 lb

## 2012-02-22 DIAGNOSIS — R002 Palpitations: Secondary | ICD-10-CM

## 2012-02-22 DIAGNOSIS — R9431 Abnormal electrocardiogram [ECG] [EKG]: Secondary | ICD-10-CM

## 2012-02-22 DIAGNOSIS — R42 Dizziness and giddiness: Secondary | ICD-10-CM

## 2012-02-22 NOTE — Assessment & Plan Note (Signed)
Nonspecific ST/T wave changes.  RAD due to body habitus.  Neither clinically significant

## 2012-02-22 NOTE — Assessment & Plan Note (Signed)
Likely related to meds.  Would try to wean off centrally acting antidepressants, narcotics and adderall.  Recommended stress echo for patient to R/O structural heart disease and assess HR and BP response to exercise but patient will probably decline due to cost

## 2012-02-22 NOTE — Assessment & Plan Note (Signed)
Benign.  Likely related to meds and anxiety.  D/C Adderall if possible

## 2012-02-22 NOTE — Progress Notes (Signed)
51 yo referred by primary Deveswher for palpitations.  Benign sounding.  Skips with irregularity.  Not necessarily related to exercise.  She is under a lot of stress.  She is divorced with financial concerns.  Originally from [place] but here because her daughters are at [facility].  She works part time as a Biomedical scientist  She has been on Dealer for a couple of years for back pain.  She is on Adderall to help her focus.  She is also on desyrl and zoloft.  There was a question of postural symptoms.  In our office she had less than 10mm Hg drop in BP and no true POTS.  She is concerned about the cost of testing  I had a frank discussion regarding her polypharmacy.  I think any issues regarding dizzyness and palpitations relate to her medications.  The only long term medicine she needs to be on is synthroid.  Encouraged her to talk to her primary about adjusting her meds.  ROS: Denies fever, malais, weight loss, blurry vision, decreased visual acuity, cough, sputum, SOB, hemoptysis, pleuritic pain, palpitaitons, heartburn, abdominal pain, melena, lower extremity edema, claudication, or rash.  All other systems reviewed and negative   General: Affect appropriate Healthy:  appears stated age HEENT: normal Neck supple with no adenopathy JVP normal no bruits no thyromegaly Lungs clear with no wheezing and good diaphragmatic motion Heart:  S1/S2 no murmur,rub, gallop or click PMI normal Abdomen: benighn, BS positve, no tenderness, no AAA no bruit.  No HSM or HJR Distal pulses intact with no bruits No edema Neuro non-focal Skin warm and dry No muscular weakness  Medications Current Outpatient Prescriptions  Medication Sig Dispense Refill  . ALPRAZolam (XANAX) 0.5 MG tablet Take 0.5 mg by mouth 2 (two) times daily. AT BEDTIME      . amphetamine-dextroamphetamine (ADDERALL) 20 MG tablet Take 20 mg by mouth daily.       Marland Kitchen levothyroxine (SYNTHROID, LEVOTHROID) 50 MCG tablet Take 50 mcg by mouth daily.      Marland Kitchen  oxyCODONE-acetaminophen (PERCOCET) 10-325 MG per tablet Take 1 tablet by mouth every 8 (eight) hours as needed. Back pain  90 tablet  0  . sertraline (ZOLOFT) 100 MG tablet Take 100 mg by mouth daily.      . traZODone (DESYREL) 100 MG tablet         Allergies Sulfa antibiotics  Family History: Family History  Problem Relation Age of Onset  . Colon cancer Mother     Social History: History   Social History  . Marital Status: Divorced    Spouse Name: N/A    Number of Children: N/A  . Years of Education: N/A   Occupational History  . Not on file.   Social History Main Topics  . Smoking status: Never Smoker   . Smokeless tobacco: Never Used  . Alcohol Use: No  . Drug Use: No  . Sexually Active: Not on file   Other Topics Concern  . Not on file   Social History Narrative  . No narrative on file    Electrocardiogram:  NSR rate 84 nonspecific ST/T wave changes  Assessment and Plan

## 2012-02-22 NOTE — Patient Instructions (Addendum)
Your physician recommends that you schedule a follow-up appointment in: as needed  

## 2012-02-26 ENCOUNTER — Encounter
Payer: BC Managed Care – PPO | Attending: Physical Medicine & Rehabilitation | Admitting: Physical Medicine & Rehabilitation

## 2012-02-26 ENCOUNTER — Encounter: Payer: Self-pay | Admitting: Physical Medicine & Rehabilitation

## 2012-02-26 VITALS — BP 101/67 | HR 101 | Resp 16 | Ht 70.0 in | Wt 149.0 lb

## 2012-02-26 DIAGNOSIS — M47816 Spondylosis without myelopathy or radiculopathy, lumbar region: Secondary | ICD-10-CM | POA: Insufficient documentation

## 2012-02-26 DIAGNOSIS — IMO0001 Reserved for inherently not codable concepts without codable children: Secondary | ICD-10-CM

## 2012-02-26 DIAGNOSIS — M2170 Unequal limb length (acquired), unspecified site: Secondary | ICD-10-CM | POA: Insufficient documentation

## 2012-02-26 DIAGNOSIS — M129 Arthropathy, unspecified: Secondary | ICD-10-CM | POA: Insufficient documentation

## 2012-02-26 DIAGNOSIS — M51379 Other intervertebral disc degeneration, lumbosacral region without mention of lumbar back pain or lower extremity pain: Secondary | ICD-10-CM | POA: Insufficient documentation

## 2012-02-26 DIAGNOSIS — M7918 Myalgia, other site: Secondary | ICD-10-CM

## 2012-02-26 DIAGNOSIS — M5137 Other intervertebral disc degeneration, lumbosacral region: Secondary | ICD-10-CM | POA: Insufficient documentation

## 2012-02-26 DIAGNOSIS — M545 Low back pain, unspecified: Secondary | ICD-10-CM | POA: Insufficient documentation

## 2012-02-26 DIAGNOSIS — M47817 Spondylosis without myelopathy or radiculopathy, lumbosacral region: Secondary | ICD-10-CM

## 2012-02-26 MED ORDER — CARISOPRODOL 350 MG PO TABS: 350.0000 mg | ORAL_TABLET | Freq: Three times a day (TID) | ORAL | Status: AC | PRN

## 2012-02-26 MED ORDER — OXYCODONE-ACETAMINOPHEN 10-325 MG PO TABS: 1.0000 | ORAL_TABLET | Freq: Three times a day (TID) | ORAL | Status: DC | PRN

## 2012-02-26 NOTE — Patient Instructions (Signed)
Focus on lengthening of your core and lumbar flexion exercises

## 2012-02-26 NOTE — Progress Notes (Signed)
  Subjective:    Patient ID: Nancy Glenn, female    DOB: 1961-05-13, 51 y.o.   MRN: [id number]  HPI Jenafer Winterton is back regarding her low back pain. She reports some fatigue also with heart palpitations which were felt to be due her adderall.  She was also taking her trazodone during the day. She is seeing a rheumatologist in the next week or so to assess her ?thyroid.    Walking and medications help the most. She takes naproxen only at night because it makes her sleepy. She has started yoga to improved her flexibility but makes her "hurt". She hasn't walked as much because of the cooler weather recently. She's been driving her daughter around playing ball the ball quite a bit over the last several weeks he notes that driving makes her back tighten up. She does note that it is difficult to stand up after she's been sitting for a while in the car. She doesn't use or inversion table any further. She's not regular with her Pilates exercises.   Pain Inventory Average Pain 7 Pain Right Now 4 My pain is constant, stabbing and aching  In the last 24 hours, has pain interfered with the following? General activity 6 Relation with others 6 Enjoyment of life 6 What TIME of day is your pain at its worst? morning, daytime, evening Sleep (in general) Fair  Pain is worse with: bending and sitting Pain improves with: medication Relief from Meds: 8  Mobility walk without assistance how many minutes can you walk? 30 ability to climb steps?  yes do you drive?  yes  Function employed # of hrs/week 12  Neuro/Psych depression  Prior Studies Any changes since last visit?  no  Physicians involved in your care Any changes since last visit?  no      Review of Systems  Musculoskeletal: Positive for back pain.  Psychiatric/Behavioral: Positive for dysphoric mood.  All other systems reviewed and are negative.       Objective:   Physical Exam  Constitutional: She is oriented to person,  place, and time. She appears well-developed and well-nourished.  HENT:  Head: Normocephalic and atraumatic.  Eyes: EOM are normal.  Neck: Normal range of motion. Neck supple.  Cardiovascular: Normal rate and regular rhythm.   Pulmonary/Chest: Effort normal and breath sounds normal.  Abdominal: Bowel sounds are normal.  Musculoskeletal: Normal range of motion.  Neurological: She is alert and oriented to person, place, and time.  Skin: Skin is warm.  Psychiatric: She has a normal mood and affect. Her behavior is normal. Judgment and thought content normal.          Assessment & Plan:   ASSESSMENT: Multifactorial low back pain due to leg length discrepancy  as well as facet arthropathy, postural issues, and degenerative disk  disease.   PLAN:  1. Pain appears to be somewhat stable at this point. Will add soma prn for muscle spasms 2. Continue with naproxen but she can use prn  3. We will stay with her Percocet 10/325 one q.8 hours p.r.n., #90.  4. Discussed continue modality, stretching, etc. Focus should be on lengthening exercises and flexion stretches. 5. I can't help but to think that her anxiety is playing a role here. Follow up in 3 months

## 2012-02-27 ENCOUNTER — Encounter: Payer: Self-pay | Admitting: Physical Medicine & Rehabilitation

## 2012-03-03 ENCOUNTER — Encounter: Payer: Self-pay | Admitting: Physical Medicine & Rehabilitation

## 2012-03-28 ENCOUNTER — Telehealth: Payer: Self-pay | Admitting: Physical Medicine & Rehabilitation

## 2012-03-28 DIAGNOSIS — M7918 Myalgia, other site: Secondary | ICD-10-CM

## 2012-03-28 DIAGNOSIS — M47816 Spondylosis without myelopathy or radiculopathy, lumbar region: Secondary | ICD-10-CM

## 2012-03-28 MED ORDER — OXYCODONE-ACETAMINOPHEN 10-325 MG PO TABS: 1.0000 | ORAL_TABLET | Freq: Three times a day (TID) | ORAL | Status: DC | PRN

## 2012-03-28 NOTE — Telephone Encounter (Signed)
Pt aware that rx will be ready for her to pick up on Monday.

## 2012-03-28 NOTE — Telephone Encounter (Signed)
Needs refill on Oxycodone.  Requesting to pick up on Monday.

## 2012-04-29 ENCOUNTER — Telehealth: Payer: Self-pay | Admitting: Physical Medicine & Rehabilitation

## 2012-04-29 DIAGNOSIS — M7918 Myalgia, other site: Secondary | ICD-10-CM

## 2012-04-29 DIAGNOSIS — M47816 Spondylosis without myelopathy or radiculopathy, lumbar region: Secondary | ICD-10-CM

## 2012-04-29 MED ORDER — OXYCODONE-ACETAMINOPHEN 10-325 MG PO TABS: 1.0000 | ORAL_TABLET | Freq: Three times a day (TID) | ORAL | Status: DC | PRN

## 2012-04-29 NOTE — Telephone Encounter (Signed)
Needs oxycodone refill and letter stating there is no way she can serve on jury duty.

## 2012-04-29 NOTE — Telephone Encounter (Signed)
Oxycodone rx has been printed for Dr. Riley Kill to sign.  Please write letter for patient. Thanks.

## 2012-04-30 ENCOUNTER — Encounter: Payer: Self-pay | Admitting: Physical Medicine & Rehabilitation

## 2012-04-30 NOTE — Telephone Encounter (Signed)
done

## 2012-05-15 ENCOUNTER — Telehealth: Payer: Self-pay

## 2012-05-15 NOTE — Telephone Encounter (Signed)
Pt request that she start taking 1.5 pills of her oxy instead of 1.  Please advise.

## 2012-05-16 ENCOUNTER — Telehealth: Payer: Self-pay | Admitting: Physical Medicine & Rehabilitation

## 2012-05-16 NOTE — Telephone Encounter (Signed)
Pt informed and will call to make an appt to change any other medications.

## 2012-05-16 NOTE — Telephone Encounter (Signed)
In so much pain.  Can she take 1-1/2 Oxycodone?  Soma doesn't seem to work well, can Dr write for a different one so maybe she will not have to take 15mg .

## 2012-05-16 NOTE — Telephone Encounter (Signed)
She can try to take an anti-inflammatory, like ibuprofen, if she can tolerate those and does not have a stomach ulcer. She can try to apply ice or heat, if her pain is too severe we might have to see her in the office to evaluate.

## 2012-05-16 NOTE — Telephone Encounter (Signed)
Absolutely not.  Perhaps she needs to get another pain clinic's opinion regarding management. i'm not sure that i have anything else to offer her.  Despite interventions and recommendations all that ever "works" for her are narcotics. i don't feel that her pain is that bad to warrant continued escalation of narcs.

## 2012-05-16 NOTE — Telephone Encounter (Signed)
Please advise. Thanks.  

## 2012-05-19 NOTE — Telephone Encounter (Signed)
Dr. Riley Kill has already addressed this and Nancy Glenn has made patient aware that she can't increase medication. Pt stated that she figured that Dr. Riley Kill wouldn't let her increase meds.

## 2012-05-23 ENCOUNTER — Telehealth: Payer: Self-pay | Admitting: Physical Medicine & Rehabilitation

## 2012-05-23 DIAGNOSIS — M7918 Myalgia, other site: Secondary | ICD-10-CM

## 2012-05-23 DIAGNOSIS — M47816 Spondylosis without myelopathy or radiculopathy, lumbar region: Secondary | ICD-10-CM

## 2012-05-23 MED ORDER — OXYCODONE-ACETAMINOPHEN 10-325 MG PO TABS: 1.0000 | ORAL_TABLET | Freq: Three times a day (TID) | ORAL | Status: DC | PRN

## 2012-05-23 NOTE — Telephone Encounter (Signed)
Refill on Oxycodone for next week.

## 2012-05-23 NOTE — Telephone Encounter (Signed)
Pt aware that we will have rx ready for her on 05/28/12 since she isn't due for a refill until 05/30/12. She understood and will pick up then.

## 2012-06-23 ENCOUNTER — Telehealth: Payer: Self-pay

## 2012-06-23 DIAGNOSIS — M47816 Spondylosis without myelopathy or radiculopathy, lumbar region: Secondary | ICD-10-CM

## 2012-06-23 DIAGNOSIS — M7918 Myalgia, other site: Secondary | ICD-10-CM

## 2012-06-23 MED ORDER — OXYCODONE-ACETAMINOPHEN 10-325 MG PO TABS: 1.0000 | ORAL_TABLET | Freq: Three times a day (TID) | ORAL | Status: DC

## 2012-06-23 NOTE — Telephone Encounter (Signed)
Pt needs refill on oxy

## 2012-06-23 NOTE — Telephone Encounter (Signed)
Script printed.  Waiting to be signed then will let pt know to pick up.

## 2012-06-24 NOTE — Telephone Encounter (Signed)
Rx is ready for pick up, pt aware. 

## 2012-07-21 ENCOUNTER — Telehealth: Payer: Self-pay | Admitting: *Deleted

## 2012-07-21 DIAGNOSIS — M7918 Myalgia, other site: Secondary | ICD-10-CM

## 2012-07-21 DIAGNOSIS — M47816 Spondylosis without myelopathy or radiculopathy, lumbar region: Secondary | ICD-10-CM

## 2012-07-21 MED ORDER — OXYCODONE-ACETAMINOPHEN 10-325 MG PO TABS: 1.0000 | ORAL_TABLET | Freq: Three times a day (TID) | ORAL | Status: DC

## 2012-07-21 NOTE — Telephone Encounter (Signed)
Refill Oxycodone 

## 2012-07-21 NOTE — Telephone Encounter (Signed)
Printed for Dr. Riley Kill to sign when he returns to the office.

## 2012-08-11 ENCOUNTER — Other Ambulatory Visit: Payer: Self-pay | Admitting: Obstetrics & Gynecology

## 2012-08-11 DIAGNOSIS — Z1231 Encounter for screening mammogram for malignant neoplasm of breast: Secondary | ICD-10-CM

## 2012-08-12 ENCOUNTER — Other Ambulatory Visit: Payer: Self-pay | Admitting: Obstetrics & Gynecology

## 2012-08-12 DIAGNOSIS — M8580 Other specified disorders of bone density and structure, unspecified site: Secondary | ICD-10-CM

## 2012-08-13 ENCOUNTER — Ambulatory Visit
Admission: RE | Admit: 2012-08-13 | Discharge: 2012-08-13 | Disposition: A | Discharge status: Home / Self Care | Payer: BC Managed Care – PPO | Source: Ambulatory Visit

## 2012-08-13 ENCOUNTER — Ambulatory Visit
Admission: RE | Admit: 2012-08-13 | Discharge: 2012-08-13 | Disposition: A | Discharge status: Home / Self Care | Payer: BC Managed Care – PPO | Source: Ambulatory Visit | Attending: Obstetrics & Gynecology | Admitting: Obstetrics & Gynecology

## 2012-08-13 DIAGNOSIS — M8580 Other specified disorders of bone density and structure, unspecified site: Secondary | ICD-10-CM

## 2012-08-13 DIAGNOSIS — Z1231 Encounter for screening mammogram for malignant neoplasm of breast: Secondary | ICD-10-CM

## 2012-08-15 ENCOUNTER — Other Ambulatory Visit: Payer: BC Managed Care – PPO

## 2012-08-19 ENCOUNTER — Telehealth: Payer: Self-pay

## 2012-08-19 DIAGNOSIS — M47816 Spondylosis without myelopathy or radiculopathy, lumbar region: Secondary | ICD-10-CM

## 2012-08-19 DIAGNOSIS — M7918 Myalgia, other site: Secondary | ICD-10-CM

## 2012-08-19 MED ORDER — OXYCODONE-ACETAMINOPHEN 10-325 MG PO TABS: 1.0000 | ORAL_TABLET | Freq: Three times a day (TID) | ORAL | Status: DC

## 2012-08-19 NOTE — Telephone Encounter (Signed)
Printed for Karen to sign 

## 2012-08-19 NOTE — Telephone Encounter (Signed)
Rx is ready to be picked up, pt aware. 

## 2012-08-19 NOTE — Telephone Encounter (Signed)
Request oxy refill.

## 2012-08-27 ENCOUNTER — Encounter: Payer: BC Managed Care – PPO | Admitting: Physical Medicine & Rehabilitation

## 2012-09-11 ENCOUNTER — Telehealth: Payer: Self-pay | Admitting: Physical Medicine & Rehabilitation

## 2012-09-11 NOTE — Telephone Encounter (Signed)
Refill on Oxycontin. 

## 2012-09-12 NOTE — Telephone Encounter (Signed)
Nancy Glenn last RX was given to her on 08/19/12 and she has an appointment with Dr Riley Kill on 09/17/12 so she should have enough to make it to that appointment and get rx then

## 2012-09-17 ENCOUNTER — Encounter: Payer: Self-pay | Admitting: Physical Medicine & Rehabilitation

## 2012-09-17 ENCOUNTER — Encounter
Payer: BC Managed Care – PPO | Attending: Physical Medicine & Rehabilitation | Admitting: Physical Medicine & Rehabilitation

## 2012-09-17 VITALS — BP 118/80 | HR 87 | Ht 70.0 in | Wt 147.0 lb

## 2012-09-17 DIAGNOSIS — IMO0001 Reserved for inherently not codable concepts without codable children: Secondary | ICD-10-CM

## 2012-09-17 DIAGNOSIS — M129 Arthropathy, unspecified: Secondary | ICD-10-CM | POA: Insufficient documentation

## 2012-09-17 DIAGNOSIS — M47812 Spondylosis without myelopathy or radiculopathy, cervical region: Secondary | ICD-10-CM

## 2012-09-17 DIAGNOSIS — M5137 Other intervertebral disc degeneration, lumbosacral region: Secondary | ICD-10-CM | POA: Insufficient documentation

## 2012-09-17 DIAGNOSIS — G8929 Other chronic pain: Secondary | ICD-10-CM | POA: Insufficient documentation

## 2012-09-17 DIAGNOSIS — M545 Low back pain, unspecified: Secondary | ICD-10-CM | POA: Insufficient documentation

## 2012-09-17 DIAGNOSIS — M7918 Myalgia, other site: Secondary | ICD-10-CM

## 2012-09-17 DIAGNOSIS — M47817 Spondylosis without myelopathy or radiculopathy, lumbosacral region: Secondary | ICD-10-CM

## 2012-09-17 DIAGNOSIS — M51379 Other intervertebral disc degeneration, lumbosacral region without mention of lumbar back pain or lower extremity pain: Secondary | ICD-10-CM | POA: Insufficient documentation

## 2012-09-17 DIAGNOSIS — M47816 Spondylosis without myelopathy or radiculopathy, lumbar region: Secondary | ICD-10-CM

## 2012-09-17 DIAGNOSIS — M2170 Unequal limb length (acquired), unspecified site: Secondary | ICD-10-CM | POA: Insufficient documentation

## 2012-09-17 DIAGNOSIS — F411 Generalized anxiety disorder: Secondary | ICD-10-CM | POA: Insufficient documentation

## 2012-09-17 DIAGNOSIS — Z5181 Encounter for therapeutic drug level monitoring: Secondary | ICD-10-CM

## 2012-09-17 MED ORDER — OXYCODONE-ACETAMINOPHEN 10-325 MG PO TABS: 1.0000 | ORAL_TABLET | Freq: Three times a day (TID) | ORAL | Status: DC

## 2012-09-17 MED ORDER — NAPROXEN 500 MG PO TABS: 500.0000 mg | ORAL_TABLET | Freq: Two times a day (BID) | ORAL | Status: DC

## 2012-09-17 MED ORDER — FENTANYL 12 MCG/HR TD PT72: 1.0000 | MEDICATED_PATCH | TRANSDERMAL | Status: DC

## 2012-09-17 NOTE — Patient Instructions (Signed)
Call me with questions 

## 2012-09-17 NOTE — Progress Notes (Signed)
Subjective:    Patient ID: Nancy Glenn, female    DOB: 09-04-1961, 51 y.o.   MRN: [id number]  HPI  Nancy Glenn is back regarding her chronic low back pain. She states that over the last 2 months her back pain has been worse. She ties it to the fact that she has been working again. 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 She tries to stretch but finds it hasn't been helping. She has been using her percocets but feels they aren't working anymore. She requests an increase.  She is taking nothing else for her pain currently.   Pain is in the right mid/low back with occasional radiation into her buttocks. She denies weakness or numbness in her legs. i reviewed a recent bone scan which was normal.   Pain Inventory Average Pain 6 Pain Right Now 7 My pain is constant, sharp, stabbing and aching  In the last 24 hours, has pain interfered with the following? General activity 9 Relation with others 9 Enjoyment of life 9 What TIME of day is your pain at its worst? all of the time Sleep (in general) Fair  Pain is worse with: bending, sitting and inactivity Pain improves with: medication Relief from Meds: 5  Mobility walk without assistance  Function employed # of hrs/week 40  Neuro/Psych No problems in this area  Prior Studies Any changes since last visit?  no  Physicians involved in your care Any changes since last visit?  no   Family History  Problem Relation Age of Onset  . Colon cancer Mother    History   Social History  . Marital Status: Divorced    Spouse Name: N/A    Number of Children: N/A  . Years of Education: N/A   Social History Main Topics  . Smoking status: Never Smoker   . Smokeless tobacco: Never Used  . Alcohol Use: No  . Drug Use: No  . Sexually Active: None   Other Topics Concern  . None   Social History Narrative  . None   Past Surgical History  Procedure Date    . Gum surgery    Past Medical History  Diagnosis Date  . Arthritis     lower back  . Anxiety   . Depression   . Thyroid disease     hypo   BP 118/80  Pulse 87  Ht 5\' 10"  (1.778 m)  Wt 147 lb (66.679 kg)  BMI 21.09 kg/m2  SpO2 97%    Review of Systems  Musculoskeletal: Positive for back pain.  All other systems reviewed and are negative.       Objective:   Physical Exam Constitutional: She is oriented to person, place, and time. She appears well-developed and well-nourished. She is in 3 inch high heeled dress shoes HENT:  Head: Normocephalic and atraumatic.  Eyes: EOM are normal.  Neck: Normal range of motion. Neck supple.  Cardiovascular: Normal rate and regular rhythm.  Pulmonary/Chest: Effort normal and breath sounds normal.  Abdominal: Bowel sounds are normal.  Musculoskeletal: back is notable for pain and spasm in her right lumbar paraspinals, centered moreso at the right L3-4 level. Spine appeared to be slightl rotated in a clockwise direction. Pain was worse with extension and side bending to the right as well as with facet maneuvers. Flexion caused some tenderness as well but to a much lesser extent. SLR and  FABER testing was equivocal to negative.  Neurological: She is alert and oriented to person, place, and time.  Skin: Skin is warm.  Psychiatric: she is anxious, attention is poor at times..    Assessment & Plan:   ASSESSMENT: Multifactorial low back pain due to leg length discrepancy  as well as facet arthropathy, postural issues, and degenerative disk  disease. I think her current pain centers around the L3-4 level and is most consistent with facet mediated pain on the right. PLAN:  1. Orderd xrays of the lumbar spine to reassess her facets and disk spaces. I do feel that another trial of MBB's, but this time at the L3-4 level would be helpful. 2. Resume naproxen but with prescription dose of 500mg  q12 with food 3. We will stay with her Percocet 10/325  one q.8 hours p.r.n., #90 for breakthrough pain. Discouraged the further escalation of this short acting narcotic. I did agree to initiate a fentanyl patch at 12.88mcg. The patient willl need to bring in bottles, boxes, etc and comply with our program. A UDS was collected today. 4. Discussed continue modality, stretching, etc. Focus should be on lengthening exercises and flexion stretches. Discussed the use of appropriate shoeware as well, although the patient discounts that as having any contribution to her presentation. 5. Again, her anxiety is a factor, and her pain complaints typically seem to be out of proportion to her clinical and radiographical picture.  6. 30 minutes of face to face patient care time were spent during this visit. All questions were encouraged and answered. 7. RTC 1 month

## 2012-09-29 ENCOUNTER — Telehealth: Payer: Self-pay | Admitting: *Deleted

## 2012-09-29 NOTE — Telephone Encounter (Signed)
Patient called and left message that patches not working; wants to know if she is applying them correctly. Patient dropped off CD of lumbar spine xray to be reviewed.

## 2012-09-30 NOTE — Telephone Encounter (Signed)
Patient should put her fentanyl patches on her chest or upper arm, Dr. Riley Kill said he told her that. Also X-ray shows decreased disc space at L4-5, where she had disc herniation, does not look that severe, but X-rays do not tell us about the soft tissue, her last MRI was in 2009, to further evaluate she would need a new MRI

## 2012-10-01 NOTE — Telephone Encounter (Signed)
I spoke with the patient on 09/30/12 regarding on how to apply her fentanyl patches per Clydie Braun and Dr.Swartz. I also told the patient that her xrays were reviewed per Su Monks PA-C and went over the below message. I explained to the patient that a new MRI was recommended. The patient stated she did not want to pursue getting a MRI due to she could not afford it. She stated would have to take medicine forever due to her pain so there was no reason to get an MRI.

## 2012-10-01 NOTE — Telephone Encounter (Signed)
Ok thanks 

## 2012-10-08 ENCOUNTER — Telehealth: Payer: Self-pay | Admitting: Physical Medicine & Rehabilitation

## 2012-10-08 DIAGNOSIS — M7918 Myalgia, other site: Secondary | ICD-10-CM

## 2012-10-08 DIAGNOSIS — M47816 Spondylosis without myelopathy or radiculopathy, lumbar region: Secondary | ICD-10-CM

## 2012-10-08 NOTE — Telephone Encounter (Signed)
Refill on patches, but does not think they are working

## 2012-10-09 ENCOUNTER — Telehealth: Payer: Self-pay | Admitting: *Deleted

## 2012-10-09 MED ORDER — FENTANYL 12 MCG/HR TD PT72: 1.0000 | MEDICATED_PATCH | TRANSDERMAL | Status: DC

## 2012-10-09 NOTE — Telephone Encounter (Signed)
rx printed for Nancy Glenn to sign 

## 2012-10-09 NOTE — Telephone Encounter (Signed)
Notified RX available for pick up. Message left on personal identifiable voicemail.

## 2012-10-17 ENCOUNTER — Encounter: Payer: Self-pay | Admitting: Physical Medicine & Rehabilitation

## 2012-10-17 ENCOUNTER — Encounter
Payer: BC Managed Care – PPO | Attending: Physical Medicine & Rehabilitation | Admitting: Physical Medicine & Rehabilitation

## 2012-10-17 VITALS — BP 103/71 | HR 88 | Resp 14 | Ht 70.0 in | Wt 151.0 lb

## 2012-10-17 DIAGNOSIS — M545 Low back pain, unspecified: Secondary | ICD-10-CM | POA: Insufficient documentation

## 2012-10-17 DIAGNOSIS — M7918 Myalgia, other site: Secondary | ICD-10-CM

## 2012-10-17 DIAGNOSIS — IMO0001 Reserved for inherently not codable concepts without codable children: Secondary | ICD-10-CM | POA: Insufficient documentation

## 2012-10-17 DIAGNOSIS — M47816 Spondylosis without myelopathy or radiculopathy, lumbar region: Secondary | ICD-10-CM

## 2012-10-17 DIAGNOSIS — M47817 Spondylosis without myelopathy or radiculopathy, lumbosacral region: Secondary | ICD-10-CM | POA: Insufficient documentation

## 2012-10-17 MED ORDER — FENTANYL 12 MCG/HR TD PT72: 1.0000 | MEDICATED_PATCH | TRANSDERMAL | Status: DC

## 2012-10-17 MED ORDER — OXYCODONE-ACETAMINOPHEN 10-325 MG PO TABS: 1.0000 | ORAL_TABLET | Freq: Three times a day (TID) | ORAL | Status: DC

## 2012-10-17 NOTE — Progress Notes (Signed)
Subjective:    Patient ID: Nancy Glenn, female    DOB: Mar 23, 1961, 51 y.o.   MRN: [id number]  HPI Nancy Glenn is back regarding her chronic low back pain. She states that over the last 2 months her back pain has been worse.  She has been using her percocets but feels they aren't working anymore. She requests an increase at the last visit, and is now also on a fentanyl patch which has helped some, but she is still in pain.  Pain is in the right mid/low back with occasional radiation into her buttocks. She denies weakness or numbness in her legs, a recent bone scan was normal.  Pain Inventory Average Pain 8 Pain Right Now 3 My pain is constant  In the last 24 hours, has pain interfered with the following? General activity 8 Relation with others 8 Enjoyment of life 8 What TIME of day is your pain at its worst? all the time Sleep (in general) Fair  Pain is worse with: sitting Pain improves with: therapy/exercise and medication Relief from Meds: 7  Mobility walk without assistance how many minutes can you walk? 60 ability to climb steps?  yes do you drive?  yes  Function not employed: date last employed 11/13  Neuro/Psych depression anxiety  Prior Studies Any changes since last visit?  no  Physicians involved in your care Any changes since last visit?  no   Family History  Problem Relation Age of Onset  . Colon cancer Mother    History   Social History  . Marital Status: Divorced    Spouse Name: N/A    Number of Children: N/A  . Years of Education: N/A   Social History Main Topics  . Smoking status: Never Smoker   . Smokeless tobacco: Never Used  . Alcohol Use: No  . Drug Use: No  . Sexually Active: None   Other Topics Concern  . None   Social History Narrative  . None   Past Surgical History  Procedure Date  . Gum surgery    Past Medical History  Diagnosis Date  . Arthritis     lower back  . Anxiety   . Depression   . Thyroid disease    hypo   BP 103/71  Pulse 88  Resp 14  Ht 5\' 10"  (1.778 m)  Wt 151 lb (68.493 kg)  BMI 21.67 kg/m2  SpO2 99%     Review of Systems  Musculoskeletal: Positive for back pain.  Psychiatric/Behavioral: Positive for dysphoric mood. The patient is nervous/anxious.   All other systems reviewed and are negative.       Objective:   Physical Exam Constitutional: She is oriented to person, place, and time. She appears well-developed and well-nourished.   HENT:  Head: Normocephalic and atraumatic.  Eyes: EOM are normal.  Neck: Normal range of motion. Neck supple.    Musculoskeletal: back is notable for pain and spasm in her right lumbar paraspinals, centered more so at the right L3-4 level. Motor strength grossly intact. Neurological: She is alert and oriented to person, place, and time.  Skin: Skin is warm.  Psychiatric: she is anxious.         Assessment & Plan:  Multifactorial low back pain due to leg length discrepancy  as well as facet arthropathy, postural issues, and degenerative disk  disease. I think her current pain centers around the L3-4 level and is most consistent with facet mediated pain on the right.  PLAN:  1. Orderd  MRI of L-spine without contrast, Patient complains about increased LBP , since 2 month, X-ray did not show any significant changes compared to her last, patient still in pain, need to further evaluate.  At first the patient did not want to have a MRI done, but when we explained to her that we can not offer her specific further treatment without a specific diagnosis. We will consider further treatment options after we have the results. The patient agreed to the MRI, only when we explained to her that we would not be able to prescribe her narcotics without significant findings, and without a specific diagnosis we basically can not offer her other treatments at this point.  2. Resume naproxen but with prescription dose of 500mg  q12 with food  3. We will stay  with her Percocet 10/325 one q.8 hours p.r.n., #90 for breakthrough pain. Discouraged the further escalation of this short acting narcotic. Initiated a fentanyl patch at 12.56mcg. The patient willl need to bring in bottles, boxes, etc and comply with our program. 4. Continue modality, stretching, etc. Focus should be on lengthening exercises and flexion stretches. Dr. Riley Kill discussed the use of appropriate shoeware at the last visit, although the patient discounts that as having any contribution to her presentation.  5. Again, her anxiety is a factor, and her pain complaints typically seem to be out of proportion to her clinical and radiographical picture.  6.  All questions were encouraged and answered.  7. RTC 1 month

## 2012-10-17 NOTE — Patient Instructions (Signed)
No follow up appointment, could see Dr. Vear Clock for further pain treatment.

## 2012-10-24 ENCOUNTER — Ambulatory Visit (INDEPENDENT_AMBULATORY_CARE_PROVIDER_SITE_OTHER): Payer: BC Managed Care – PPO | Admitting: Cardiovascular Disease

## 2012-10-24 ENCOUNTER — Ambulatory Visit
Admission: RE | Admit: 2012-10-24 | Discharge: 2012-10-24 | Disposition: A | Discharge status: Home / Self Care | Payer: BC Managed Care – PPO | Source: Ambulatory Visit | Attending: Physical Medicine and Rehabilitation | Admitting: Physical Medicine and Rehabilitation

## 2012-10-24 ENCOUNTER — Ambulatory Visit (INDEPENDENT_AMBULATORY_CARE_PROVIDER_SITE_OTHER): Payer: BC Managed Care – PPO | Admitting: Nurse Practitioner

## 2012-10-24 ENCOUNTER — Encounter: Payer: Self-pay | Admitting: Nurse Practitioner

## 2012-10-24 ENCOUNTER — Encounter: Payer: Self-pay | Admitting: Cardiovascular Disease

## 2012-10-24 VITALS — BP 104/69 | HR 87 | Wt 150.0 lb

## 2012-10-24 VITALS — BP 93/61 | HR 78

## 2012-10-24 DIAGNOSIS — R079 Chest pain, unspecified: Secondary | ICD-10-CM

## 2012-10-24 DIAGNOSIS — R002 Palpitations: Secondary | ICD-10-CM

## 2012-10-24 DIAGNOSIS — M545 Low back pain: Secondary | ICD-10-CM

## 2012-10-24 DIAGNOSIS — M47816 Spondylosis without myelopathy or radiculopathy, lumbar region: Secondary | ICD-10-CM

## 2012-10-24 NOTE — Procedures (Signed)
Exercise Treadmill Test  Pre-Exercise Testing Evaluation Rhythm: normal sinus  Rate: 80   PR:  .14 QRS:  .06  QT:  .36 QTc: .41     Test  Exercise Tolerance Test Ordering MD: Charlton Haws, MD  Interpreting MD: Meryl Dare NP  Unique Test No1  Treadmill:  1  Indication for ETT: CHEST PAIN  Contraindication to ETT: Yes   Stress Modality: exercise - treadmill  Cardiac Imaging Performed: non   Protocol: standard Bruce - maximal  Max BP:  153/59  Max MPHR (bpm):  169 85% MPR (bpm):  144  MPHR obtained (bpm):  164 % MPHR obtained:  97%  Reached 85% MPHR (min:sec):  6:10 Total Exercise Time (min-sec):  8:55  Workload in METS:  12.6 Borg Scale: 15  Reason ETT Terminated:  patient's desire to stop    ST Segment Analysis At Rest: Non-specific ST changes/sinus rhythm. With Exercise: no evidence of significant ST depression  Other Information Arrhythmia:  No Angina during ETT:  absent (0) Quality of ETT:  diagnostic  ETT Interpretation:  normal - no evidence of ischemia by ST analysis  Comments: Patient presents today for routine GXT testing. Has had some atypical chest pain and palpitations.   Today, she exercised on the standard Bruce protocol for a total of 9 minutes. She has good exercise tolerance. Adequate blood pressure response. Clinically negative for chest pain. Test stopped due to fatigue. Target was achieved. EKG was negative for ischemia. No arrhythmia noted.   Recommendations: CV risk factor modification.  Patient is agreeable to this plan and will call if any problems develop in the interim.

## 2012-10-24 NOTE — Progress Notes (Signed)
Patient ID: Nancy Glenn, female   DOB: 10-09-1961, 51 y.o.   MRN: [id number] 51 yo chronic back pain, palpitations and anxiety.  Had been on adderall and this has been stopped.  No history of structural heart disease .  MVP but not documented on echo.  Palpitations  Still occur but related to stress Started a new job  Has 3 daughters the youngest Fleet Contras plays volleyball with my daughter.  Seen by rheumatologist and wanted stress test since palpitations persisted off Adderall .  She gets occassional pressure in chest Again related to anxiety.  Sometimes occur with palpitations  Activity limited by back pain.  Mild exertional dyspnea.    ROS: Denies fever, malais, weight loss, blurry vision, decreased visual acuity, cough, sputum, SOB, hemoptysis, pleuritic pain, palpitaitons, heartburn, abdominal pain, melena, lower extremity edema, claudication, or rash.  All other systems reviewed and negative  General: Affect appropriate Healthy:  appears stated age HEENT: normal Neck supple with no adenopathy JVP normal no bruits no thyromegaly Lungs clear with no wheezing and good diaphragmatic motion Heart:  S1/S2 no murmur, no rub, gallop or click PMI normal Abdomen: benighn, BS positve, no tenderness, no AAA no bruit.  No HSM or HJR Distal pulses intact with no bruits No edema Neuro non-focal Skin warm and dry No muscular weakness   Current Outpatient Prescriptions  Medication Sig Dispense Refill  . ALPRAZolam (XANAX) 0.5 MG tablet Take 0.5 mg by mouth 2 (two) times daily. AT BEDTIME      . fentaNYL (DURAGESIC - DOSED MCG/HR) 12 MCG/HR Place 1 patch (12.5 mcg total) onto the skin every 3 (three) days.  10 patch  0  . levothyroxine (SYNTHROID, LEVOTHROID) 50 MCG tablet Take 50 mcg by mouth daily.      Marland Kitchen oxyCODONE-acetaminophen (PERCOCET) 10-325 MG per tablet Take 1 tablet by mouth every 8 (eight) hours. Back pain  90 tablet  0  . PRISTIQ 100 MG 24 hr tablet Take 1 tablet by mouth Daily.      .  traZODone (DESYREL) 100 MG tablet as needed.       . [DISCONTINUED] CALCIUM CARBONATE PO Take 2 tablets by mouth daily.        Allergies  Sulfa antibiotics  Electrocardiogram:  02/22/12  SR rate 89 normal   Assessment and Plan

## 2012-10-24 NOTE — Patient Instructions (Addendum)
The current medical regimen is effective;  continue present plan and medications.  Your physician has requested that you have an exercise tolerance test. For further information please visit [email or web address] Please also follow instruction sheet, as given.  Please sign up for My Chart.  Instructions included.

## 2012-10-24 NOTE — Assessment & Plan Note (Signed)
Atypical related to anxiety, palpitations and ? Chronic back pain.  Normal ECG  F/U ETT

## 2012-10-24 NOTE — Assessment & Plan Note (Signed)
Benign Avoid caffeine and stimulants.  Off Adderall ETT to R/O exercise induced arrhythmia

## 2012-10-27 ENCOUNTER — Telehealth: Payer: Self-pay

## 2012-10-27 NOTE — Telephone Encounter (Signed)
Had MRI and was given 2 discs with report.  Patient wants to know if she needs to bring them in or what to do with them.

## 2012-10-27 NOTE — Telephone Encounter (Signed)
I spoke with Nancy Glenn and told her she could just bring them to her next appointment. She said she wasn't sure if she has another appointment. I told her I did not see one scheduled and she would need to schedule one. She says she will just drop the scan discs by our office.

## 2012-11-04 ENCOUNTER — Telehealth: Payer: Self-pay | Admitting: Physical Medicine & Rehabilitation

## 2012-11-04 NOTE — Telephone Encounter (Signed)
Nancy Glenn called wanting to know about her MRI results. We offered her an appointment with Nancy Glenn to go over Her results. Nancy Glenn stated she did not to see Nancy Glenn she would like to see Nancy Glenn. Please advise.

## 2012-11-04 NOTE — Telephone Encounter (Signed)
Talked to Dr. Riley Kill about how to taper down, he stated that she can d/c her Fentanyl patch, 12.5 without tapering down. She should taper down her Oxycodone from taking 3 /day, to taking 2 /day for 2 weeks, then just taking 1 tablet for another 2 weeks, and then d/c . Aquatic therapy is very beneficial in building up her core muscles ( abdominal, back and hip muscles) without putting stress or impact on her spine, after 4-6 weeks usually the muscles are much stronger and support the spine also on land, the stronger the core muscles the less stress/impact on the spine.

## 2012-11-04 NOTE — Telephone Encounter (Signed)
The MRI shows :  No change in degenerative disc disease most notable at L4-5  where a disc bulge eccentric to the left indents the ventral thecal  sac but there is no nerve root compression. Degenerative changes at the other levels are just mild, also no change compared to former MRI. Given these diagnoses, we would recommend PT, or an injection at L4-5, we do not think narcotics are a promising treatment in the long run. Basically we could offer PT or injections, but would not continue prescribing narcotics  , given the recent MRI findings.

## 2012-11-04 NOTE — Telephone Encounter (Signed)
Called and Left message for Darolyn to return call.

## 2012-11-05 ENCOUNTER — Telehealth: Payer: Self-pay | Admitting: *Deleted

## 2012-11-05 MED ORDER — OXYCODONE-ACETAMINOPHEN 10-325 MG PO TABS: ORAL_TABLET | ORAL | Status: DC

## 2012-11-05 NOTE — Telephone Encounter (Signed)
I spoke with Ms Scioli about her concerns with her pain. She feels that it is muscular and not bone pain. She is unhappy with her communication with Dr Riley Kill and feels that he is not understanding her pain. She does not  agree with his recommendations for therapies to strengthen her core muscles to provide better support for her spine such as aquatic therapy proceeding to land based therapy. Since she is adamant that this is not the answer for her problem, he is in agreement that she should pursue a second opinion with another pain physician who may be able to better meet her needs.   We discussed the plan to taper down her narcotics over the next 4 weeks.  She is on low dose fentanyl 12 mcg and can stop that immediately. Her Percocet will need to be tapered off.  I gave her a schedule for doing this, dropping from one tablet three times daily to one tablet two times daily for the next two weeks, and proceeding to decrease further to one time daily for two additional weeks and then stopping. She was confused as to why she had to do this taper. I discussed withdrawal symptoms from opioids such as chills, nausea, diarrhea, and extreme flu type feelings. She was affronted by this, feeling like we were indicating she was a "drug addict".  I assured her that this is not the case and is a common occurrence  with anyone who has taken an opioid for an extended period of time, which she has done. I told her she has the option to not taper, the symptoms of withdrawal are not something that will harm her, but that they could be very uncomfortable for a short period of time. She opts to go with the taper.  I made sure she understood the taper schedule and reviewed the number of pills she has at present, which she counted while I was on the phone. Our Practice Manager, Aurelio Jew was present on the phone conversation as well.  She has 28 pills which will fulfill the first two week taper of one tablet twice per day.  We will  write her a second prescription for 14 additional pills to allow her to proceed with the third and fourth week of the taper at one pill per day, and then stop the medication completely.   Pamelia Hoit and I explained the referral process and that we do not refer to other pain management physicians of the same specialty practice, but can offer a list of surrounding physicians. She will need to go through her primary care physician for this referral.  She stated that she does not have one, and [facility] was suggested on [address]. She has already contacted a Dr Gwenlyn Fudge out of [place]. Her concern is that he would see Dr Rosalyn Charters opinions and she did not want that to be the case. I explained the referral process, since I am in charge of reviewing referrals into our clinic, and that yes, previous records are reviewed but that each individual physician forms his own opinions based on the physical evidence that he reviews, such as X-rays, MRIs, and any other studies conducted by previous physicians. She can request her records, come by our office and sign for the release, and pick up her final prescription for Percocet at that time. She agreed that she would do this.

## 2013-03-02 ENCOUNTER — Telehealth: Payer: Self-pay | Admitting: Obstetrics & Gynecology

## 2013-03-02 NOTE — Telephone Encounter (Signed)
Patient is having some problems, would like to talk with a nurse. Patient did mot give any details.

## 2013-03-02 NOTE — Telephone Encounter (Signed)
Patient reports was started on Estrogen for bleeding and was to decrease it when bleeding stopped.  She reports bleeding has restarted like a period.  Has been off estrogen for about 5 days.  Please advise next step.

## 2013-03-03 NOTE — Telephone Encounter (Signed)
Notified of Dr Rondel Baton instruction.  Patient to monitor bleeding and call back if prolonged or happens again.

## 2013-03-03 NOTE — Telephone Encounter (Signed)
She was started on Aygestin--not estrogen.  It is appropriate to have what feels like a normal cycle after stopping this.  Her hb was normal at office visit.  She needs to monitor bleeding and if it continues longer than a normal cycle, she should call.  Otherwise, this is to be expected and normal after the aygestin.

## 2013-03-31 ENCOUNTER — Emergency Department (HOSPITAL_COMMUNITY): Payer: BC Managed Care – PPO

## 2013-03-31 ENCOUNTER — Encounter (HOSPITAL_COMMUNITY): Payer: Self-pay | Admitting: *Deleted

## 2013-03-31 ENCOUNTER — Emergency Department (HOSPITAL_COMMUNITY)
Admission: EM | Admit: 2013-03-31 | Discharge: 2013-03-31 | Disposition: A | Discharge status: Home / Self Care | Payer: BC Managed Care – PPO | Attending: Emergency Medicine | Admitting: Emergency Medicine

## 2013-03-31 DIAGNOSIS — G514 Facial myokymia: Secondary | ICD-10-CM

## 2013-03-31 DIAGNOSIS — R51 Headache: Secondary | ICD-10-CM

## 2013-03-31 DIAGNOSIS — R259 Unspecified abnormal involuntary movements: Secondary | ICD-10-CM | POA: Insufficient documentation

## 2013-03-31 DIAGNOSIS — R479 Unspecified speech disturbances: Secondary | ICD-10-CM

## 2013-03-31 DIAGNOSIS — G8929 Other chronic pain: Secondary | ICD-10-CM | POA: Insufficient documentation

## 2013-03-31 DIAGNOSIS — Z79899 Other long term (current) drug therapy: Secondary | ICD-10-CM | POA: Insufficient documentation

## 2013-03-31 DIAGNOSIS — R110 Nausea: Secondary | ICD-10-CM | POA: Insufficient documentation

## 2013-03-31 DIAGNOSIS — F411 Generalized anxiety disorder: Secondary | ICD-10-CM | POA: Insufficient documentation

## 2013-03-31 DIAGNOSIS — Z7982 Long term (current) use of aspirin: Secondary | ICD-10-CM | POA: Insufficient documentation

## 2013-03-31 DIAGNOSIS — Z8739 Personal history of other diseases of the musculoskeletal system and connective tissue: Secondary | ICD-10-CM | POA: Insufficient documentation

## 2013-03-31 DIAGNOSIS — E039 Hypothyroidism, unspecified: Secondary | ICD-10-CM | POA: Insufficient documentation

## 2013-03-31 DIAGNOSIS — F329 Major depressive disorder, single episode, unspecified: Secondary | ICD-10-CM | POA: Insufficient documentation

## 2013-03-31 DIAGNOSIS — F3289 Other specified depressive episodes: Secondary | ICD-10-CM | POA: Insufficient documentation

## 2013-03-31 DIAGNOSIS — Z8679 Personal history of other diseases of the circulatory system: Secondary | ICD-10-CM | POA: Insufficient documentation

## 2013-03-31 DIAGNOSIS — R4789 Other speech disturbances: Secondary | ICD-10-CM | POA: Insufficient documentation

## 2013-03-31 NOTE — ED Notes (Signed)
Patient transported to MRI 

## 2013-03-31 NOTE — ED Provider Notes (Signed)
MR Brain Wo Contrast (Final result)  Result time: 03/31/13 18:58:35    Final result by Rad Results In Interface (03/31/13 18:58:35)    Narrative:   *RADIOLOGY REPORT*  Clinical Data: Mouth twitching. Expressive aphasia. Symptoms now resolved.  MRI HEAD WITHOUT CONTRAST  Technique: Multiplanar, multiecho pulse sequences of the brain and surrounding structures were obtained according to standard protocol without intravenous contrast.  Comparison: MR head 07/09/2008.  Findings: No acute stroke or hemorrhage. No mass lesion or hydrocephalus. Normal cerebral volume.  Scattered subcentimeter foci of increased signal in the subcortical white matter, nonspecific and stable from 2009. Considerations include chronic microvascular ischemic change, complicated migraine, atypical demyelinating process, chronic infection, vasculitis, or idiopathic. No large vessel infarct. No foci of chronic hemorrhage.  Flow voids are maintained in the internal carotid and basilar arteries. No proximal MCA occlusion is evident. The right vertebral is dominant.  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 Normal pituitary and cerebellar tonsils. No osseous findings.  Negative orbits, paranasal sinuses, and mastoids.  IMPRESSION: No acute intracranial findings.  Scattered subcortical white matter signal abnormality, stable from 2009, nonspecific.   Original Report Authenticated By: Davonna Belling, M.D.    Imaging viewed by myself and interpreted by radiology.   16:15. Care transferred from Dr. Beverely Pace; apprised of course. Pt is a 52 yo F who presented with headache and associated left facial twitching. Symptoms resolved now. Awaiting brain MRI.   19:02. Patient remains symptom-free. Brain MRI negative for evidence of infarction or hemorrhage. Suspect complicated migraine. Patient given return precautions, including worsening of signs or symptoms. Patient instructed to follow-up with primary care  physician.    Clemetine Marker, MD 03/31/13 [phone]

## 2013-03-31 NOTE — ED Notes (Signed)
Called MRI to verify when patient will be able to be done. They stated she was next. Explained this to the patient.

## 2013-03-31 NOTE — ED Notes (Signed)
Pt here with complaints of headache that started this am.  Pt advises her mouth was twitching earlier and she had a hard time forming words.  Pt GCS 15, speaking without problems currently.  Pt advises she feels much better than earlier.

## 2013-03-31 NOTE — ED Provider Notes (Signed)
Present with headache left-sided, mild twitching and difficulty speaking earlier today. All symptoms have resolved after she treated herself with aspirin patient is alert Glasgow Coma Score 15 no facial asymmetry cranial nerves II through XII intact moves all extremities well  Doug Sou, MD 04/02/13 1441

## 2013-03-31 NOTE — ED Provider Notes (Signed)
History     CSN: [id number]  Arrival date & time 03/31/13  1352   First MD Initiated Contact with Patient 03/31/13 1409      Chief Complaint  Patient presents with  . Headache    (Consider location/radiation/quality/duration/timing/severity/associated sxs/prior treatment) HPI Comments: 60 y F with PMH of anxiety, depression, CLBP and migraines brought back from triage out of concern for stroke like symptoms.  She reports that she woke up with a HA at approx 10:30 this morning.  She reports that she took some aspirin and that her HA has improved from a 6/10 at its worst to 1/10 currently.  The HA is frontal.  She also reports left sided facial twitching a/w the HA and "trouble forming my words".   No vision changes, neck pain, slurred speech, weakness, numbness, paresthesias.  Symptoms have essentially resolved at this point.   Patient is a 52 y.o. female presenting with headaches. The history is provided by the patient.  Headache Pain location:  Generalized Quality:  Dull Radiates to:  Does not radiate Severity currently:  1/10 Severity at highest:  6/10 Onset quality: noticed it with awakening. Timing:  Constant Progression:  Improving Chronicity: "3 or so migraines a year" Similar to prior headaches: no ("I had difficulty saying my words and my face was twitching.")   Relieved by:  Aspirin Associated symptoms: nausea   Associated symptoms: no abdominal pain, no back pain, no blurred vision, no congestion, no cough, no diarrhea, no ear pain, no pain, no facial pain, no fever, no focal weakness, no near-syncope, no neck pain, no neck stiffness, no numbness, no paresthesias, no photophobia, no sore throat, no syncope, no URI, no vomiting and no weakness     Past Medical History  Diagnosis Date  . Arthritis     lower back  . Anxiety   . Depression   . Thyroid disease     hypo  . Migraine     Past Surgical History  Procedure Laterality Date  . Gum surgery      Family  History  Problem Relation Age of Onset  . Colon cancer Mother     History  Substance Use Topics  . Smoking status: Never Smoker   . Smokeless tobacco: Never Used  . Alcohol Use: No    OB History   Grav Para Term Preterm Abortions TAB SAB Ect Mult Living                  Review of Systems  Constitutional: Negative for fever.  HENT: Negative for ear pain, congestion, sore throat, neck pain and neck stiffness.   Eyes: Negative for blurred vision, photophobia and pain.  Respiratory: Negative for cough.   Cardiovascular: Negative for syncope and near-syncope.  Gastrointestinal: Positive for nausea. Negative for vomiting, abdominal pain and diarrhea.  Musculoskeletal: Negative for back pain.  Neurological: Positive for headaches. Negative for focal weakness, numbness and paresthesias.  All other systems reviewed and are negative.    Allergies  Sulfa antibiotics  Home Medications   Current Outpatient Rx  Name  Route  Sig  Dispense  Refill  . ALPRAZolam (XANAX) 0.5 MG tablet   Oral   Take 0.5 mg by mouth daily.          Marland Kitchen aspirin 81 MG chewable tablet   Oral   Chew 81 mg by mouth once.         Marland Kitchen levothyroxine (SYNTHROID, LEVOTHROID) 50 MCG tablet   Oral   Take  50 mcg by mouth daily.         Marland Kitchen oxyCODONE-acetaminophen (PERCOCET) 10-325 MG per tablet   Oral   Take 1 tablet by mouth 2 (two) times daily.         . traZODone (DESYREL) 100 MG tablet   Oral   Take 25 mg by mouth at bedtime.           BP 85/62  Pulse 81  Resp 19  Ht 5\' 10"  (1.778 m)  Wt 150 lb (68.04 kg)  BMI 21.52 kg/m2  SpO2 98%  Physical Exam  Vitals reviewed. Constitutional: She is oriented to person, place, and time. She appears well-developed and well-nourished.  Appears anxious  HENT:  Right Ear: External ear normal.  Left Ear: External ear normal.  Mouth/Throat: No oropharyngeal exudate.  Eyes: Conjunctivae and EOM are normal. Pupils are equal, round, and reactive to light.   Neck: Normal range of motion. Neck supple.  Cardiovascular: Normal rate, regular rhythm, normal heart sounds and intact distal pulses.  Exam reveals no gallop and no friction rub.   No murmur heard. Pulmonary/Chest: Effort normal and breath sounds normal.  Abdominal: Soft. Bowel sounds are normal. She exhibits no distension. There is no tenderness.  Musculoskeletal: Normal range of motion. She exhibits no edema.  Neurological: She is alert and oriented to person, place, and time. She has normal strength and normal reflexes. No cranial nerve deficit or sensory deficit. She exhibits normal muscle tone. Coordination and gait normal.  F2N, gait and H2S all WNLs  Skin: Skin is warm and dry. No rash noted.  Psychiatric: She has a normal mood and affect.    ED Course  Procedures (including critical care time)  Labs Reviewed - No data to display Mr Brain Wo Contrast  03/31/2013  *RADIOLOGY REPORT*  Clinical Data: Mouth twitching.  Expressive aphasia.  Symptoms now resolved.  MRI HEAD WITHOUT CONTRAST  Technique:  Multiplanar, multiecho pulse sequences of the brain and surrounding structures were obtained according to standard protocol without intravenous contrast.  Comparison: MR head 07/09/2008.  Findings: No acute stroke or hemorrhage.  No mass lesion or hydrocephalus. Normal cerebral volume.  Scattered subcentimeter foci of increased signal in the subcortical white matter, nonspecific and stable from 2009.  Considerations include chronic microvascular ischemic change, complicated migraine, atypical demyelinating process, chronic infection, vasculitis, or idiopathic.  No large vessel infarct.  No foci of chronic hemorrhage.  Flow voids are maintained in the internal carotid and basilar arteries.  No proximal MCA occlusion is evident.  The right vertebral is dominant.  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  Normal pituitary and cerebellar tonsils.  No osseous findings.  Negative orbits,  paranasal sinuses, and mastoids.  IMPRESSION: No acute intracranial findings.  Scattered subcortical white matter signal abnormality, stable from 2009, nonspecific.   Original Report Authenticated By: Davonna Belling, M.D.      Date: 03/31/2013  Rate: 81  Rhythm: normal sinus rhythm  QRS Axis: normal, borderline RAD  Intervals: normal  ST/T Wave abnormalities: normal  Conduction Disutrbances:none  Narrative Interpretation:   Old EKG Reviewed: unchanged from EKG 02/22/12   1. Headache   2. Speech abnormality   3. Facial twitching       MDM   29 y F with PMH of anxiety, depression, CLBP and migraines brought back from triage out of concern for stroke like symptoms.  She reports that she woke up with a HA at approx 10:30 this morning.  She reports  that she took some aspirin and that her HA has improved from a 6/10 at its worst to 1/10 currently.  The HA is frontal.  She also reports left sided facial twitching a/w the HA and "trouble forming my words".   No vision changes, neck pain, slurred speech, weakness, numbness, paresthesias.  Symptoms have essentially resolved at this point.  AFVSS, well appearing.  Normal neuro exam.    Diff Dx: Complex migraine, atypical sz, TIA, SAH, anxiety.  Pt is very low risk and symptoms are very atypical.   Will eval with EKG and MR head.  4:53 PM Pt care signed out to Dr. Clemetine Marker with MR pending and plan for d/c home and PCP f/u if normal.  Pt has remained asymptomatic while in the ED.  Pt seen in conjunction with my attending, Dr. Ethelda Chick.  Oleh Genin, MD PGY-II [facility] Emergency Medicine Resident   Oleh Genin, MD 03/31/13 [phone]

## 2013-03-31 NOTE — ED Provider Notes (Signed)
7:05 PM I discussed the MRI results with the patient.  No new complaints.  During our conversation she appears in no distress, with no neurologic deficits.  We discussed the need for followup, and she was discharged in stable condition  Gerhard Munch, MD 03/31/13 1905

## 2013-03-31 NOTE — ED Notes (Signed)
Patient refused to have VS done prior to leaving.

## 2013-04-02 NOTE — ED Provider Notes (Signed)
I have personally seen and examined the patient.  I have discussed the plan of care with the resident.  I have reviewed the documentation on PMH/FH/Soc. History.  I have reviewed the documentation of the resident and agree.  Bryleigh Ottaway, MD 04/02/13 1503 

## 2013-04-02 NOTE — ED Provider Notes (Signed)
I have personally seen and examined the patient.  I have discussed the plan of care with the resident.  I have reviewed the documentation on PMH/FH/Soc. History.  I have reviewed the documentation of the resident and agree.  Jamina Macbeth, MD 04/02/13 1503 

## 2013-04-14 ENCOUNTER — Telehealth: Payer: Self-pay | Admitting: *Deleted

## 2013-04-14 NOTE — Telephone Encounter (Signed)
Last annual 04/15/12 Next annual 05/06/13 Last fill 03/18/12  Vitamin D 50, 0000 IU's

## 2013-04-14 NOTE — Telephone Encounter (Signed)
OK to RF.  Double check chart to make sure if is weekly or every other week.

## 2013-04-30 ENCOUNTER — Telehealth: Payer: Self-pay | Admitting: *Deleted

## 2013-04-30 NOTE — Telephone Encounter (Signed)
Pt calling on behalf of daughter Nancy Glenn, pt is worried Misty Stanley has been sleeping 20 hours a day, she wakes her up for a little while but then she goes right to sleep. 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 Is wondering if there is anything we can do please advise.

## 2013-05-05 ENCOUNTER — Encounter: Payer: Self-pay | Admitting: *Deleted

## 2013-05-05 NOTE — Telephone Encounter (Signed)
Per Dr. Farrel Gobble:  Pt. Notified and aware that Dr. Farrel Gobble thinks that Nancy Glenn is probably exhausted due to school etc. Also aware that Dr. Farrel Gobble recommends her seeing a PCP to follow up with this. Pt is agreeable to this plan.

## 2013-05-06 ENCOUNTER — Ambulatory Visit: Payer: Self-pay | Admitting: Obstetrics & Gynecology

## 2013-05-08 ENCOUNTER — Ambulatory Visit: Payer: Self-pay | Admitting: Obstetrics & Gynecology

## 2013-05-21 ENCOUNTER — Other Ambulatory Visit: Payer: Self-pay | Admitting: *Deleted

## 2013-05-21 MED ORDER — LEVOTHYROXINE SODIUM 50 MCG PO TABS: 50.0000 ug | ORAL_TABLET | Freq: Every day | ORAL | Status: DC

## 2013-05-21 NOTE — Telephone Encounter (Signed)
Patient seen 05/08/13

## 2013-07-13 ENCOUNTER — Telehealth: Payer: Self-pay | Admitting: Obstetrics & Gynecology

## 2013-07-13 ENCOUNTER — Encounter: Payer: Self-pay | Admitting: Obstetrics and Gynecology

## 2013-07-13 ENCOUNTER — Ambulatory Visit (INDEPENDENT_AMBULATORY_CARE_PROVIDER_SITE_OTHER): Payer: BC Managed Care – PPO | Admitting: Obstetrics and Gynecology

## 2013-07-13 DIAGNOSIS — Z202 Contact with and (suspected) exposure to infections with a predominantly sexual mode of transmission: Secondary | ICD-10-CM

## 2013-07-13 DIAGNOSIS — N390 Urinary tract infection, site not specified: Secondary | ICD-10-CM

## 2013-07-13 MED ORDER — NITROFURANTOIN MONOHYD MACRO 100 MG PO CAPS: 100.0000 mg | ORAL_CAPSULE | Freq: Two times a day (BID) | ORAL | Status: DC

## 2013-07-13 NOTE — Progress Notes (Addendum)
52 yo DWF G3P3   Wt 148 pounds BP 100/60 R=14  P=72  (EPIC issue prevent nurse from entering vitals in the vital signs section) c/o frequent urination and pain with urination for 2 days. Urine dip stick unreadable d/t pt has taken azo for 2 days.  Just broke up with a long term boyfriend because she thinks he was cheating on her.   Exam:  WF NAD  No flank pain.  BM exam:  Ext nl.  BUS neg.  Cx w/o exudate.  Uterus small, mobile, and NT.   Adnexa NT and w/o masses  A:  UTI, r/o STD  P:  Macrobid x 7 days.  Check urine culture.  Check GC/CHL.  Pt instructed to call if she is not feeling better in 2 days or if she develops a fever.

## 2013-07-13 NOTE — Addendum Note (Signed)
Addended by: Ernest Haber on: 07/13/2013 04:42 PM   Modules accepted: Orders

## 2013-07-13 NOTE — Patient Instructions (Addendum)
We will call you on Thursday with your culture results.  Call us before then if you develop a fever, or, if after 2 days, you do not feel better.

## 2013-07-13 NOTE — Telephone Encounter (Signed)
[facility] [phone]. Patient calling thinking she may have a urinary tract infection. Patient declined appointment due to daughter having wisdom teeth removed today. Please advise?

## 2013-07-13 NOTE — Telephone Encounter (Signed)
Patient called and stated she is having frequent urination of feelings as if not emptying bladder and burning with urination. Appointment offered with first available provider . Patient states she can not come today due to her daughter having her wisdom teeth removed and needs to be with her. Offered OTC medication AZO. Patient stated she was already doing that and she is 52 years old and knows when she has a UTI and to please make an  Exception this time.. Patient informed that she will need to contact her PCP or go to an Urgent Care for these symptoms. No medication can be called in from our office.

## 2013-07-16 LAB — URINE CULTURE: Colony Count: >=100000

## 2013-07-22 ENCOUNTER — Other Ambulatory Visit: Payer: BC Managed Care – PPO

## 2013-07-22 ENCOUNTER — Telehealth: Payer: Self-pay | Admitting: Obstetrics & Gynecology

## 2013-07-22 NOTE — Telephone Encounter (Signed)
Patient dnka lab appointment today. I spoke with the patient she is out of town with a sick mother. She will call later to reschedule.

## 2013-09-01 ENCOUNTER — Other Ambulatory Visit: Payer: Self-pay | Admitting: Obstetrics & Gynecology

## 2013-09-01 ENCOUNTER — Other Ambulatory Visit: Payer: Self-pay

## 2013-09-01 DIAGNOSIS — Z1231 Encounter for screening mammogram for malignant neoplasm of breast: Secondary | ICD-10-CM

## 2013-09-10 ENCOUNTER — Telehealth: Payer: Self-pay | Admitting: Obstetrics & Gynecology

## 2013-09-10 ENCOUNTER — Ambulatory Visit: Payer: Self-pay | Admitting: Obstetrics & Gynecology

## 2013-09-10 NOTE — Telephone Encounter (Signed)
Pt cancelled appointment today because of a flat tire.

## 2013-09-15 NOTE — Telephone Encounter (Signed)
Patient calling to reschedule her AEX she missed due to a flat tire. Patient states she is also having some problems with fibroids and wants to see Dr. Hyacinth Meeker for her AEX as soon as possible. I offered the patient 09/22/13 at 2:00 with Dr. Hyacinth Meeker but she declined that time as she does "not want to have a long wait and was told to come first thing in the morning or afternoon to avoid this." Patient requests to be worked in sooner during those times. Please advise?

## 2013-09-16 NOTE — Telephone Encounter (Signed)
I need to open Monday am schedule, I think.  I don't think I'm in the OR.  If you can confirm that with Kennon Rounds and open my schedule, you can put this pt in early appt.  Thanks.

## 2013-09-21 ENCOUNTER — Ambulatory Visit
Admission: RE | Admit: 2013-09-21 | Discharge: 2013-09-21 | Disposition: A | Discharge status: Home / Self Care | Payer: BC Managed Care – PPO | Source: Ambulatory Visit

## 2013-09-21 ENCOUNTER — Encounter: Payer: Self-pay | Admitting: Obstetrics & Gynecology

## 2013-09-21 ENCOUNTER — Ambulatory Visit (INDEPENDENT_AMBULATORY_CARE_PROVIDER_SITE_OTHER): Payer: BC Managed Care – PPO | Admitting: Obstetrics & Gynecology

## 2013-09-21 VITALS — BP 98/62 | HR 80 | Resp 16 | Ht 69.0 in | Wt 150.0 lb

## 2013-09-21 DIAGNOSIS — Z01419 Encounter for gynecological examination (general) (routine) without abnormal findings: Secondary | ICD-10-CM

## 2013-09-21 DIAGNOSIS — Z124 Encounter for screening for malignant neoplasm of cervix: Secondary | ICD-10-CM

## 2013-09-21 DIAGNOSIS — Z23 Encounter for immunization: Secondary | ICD-10-CM

## 2013-09-21 DIAGNOSIS — E039 Hypothyroidism, unspecified: Secondary | ICD-10-CM

## 2013-09-21 DIAGNOSIS — Z1231 Encounter for screening mammogram for malignant neoplasm of breast: Secondary | ICD-10-CM

## 2013-09-21 DIAGNOSIS — F329 Major depressive disorder, single episode, unspecified: Secondary | ICD-10-CM

## 2013-09-21 DIAGNOSIS — Z0000 Encounter for general adult medical examination without abnormal findings: Secondary | ICD-10-CM

## 2013-09-21 LAB — COMPREHENSIVE METABOLIC PANEL
AST: 20 U/L (ref 0–37)
Albumin: 4.4 g/dL (ref 3.5–5.2)
BUN: 13 mg/dL (ref 6–23)
Calcium: 10 mg/dL (ref 8.4–10.5)
Chloride: 102 mEq/L (ref 96–112)
Potassium: 5.2 mEq/L (ref 3.5–5.3)
Sodium: 139 mEq/L (ref 135–145)
Total Protein: 7 g/dL (ref 6.0–8.3)

## 2013-09-21 LAB — CBC
HCT: 41.9 % (ref 36.0–46.0)
Hemoglobin: 14.8 g/dL (ref 12.0–15.0)
MCH: 35.1 pg — ABNORMAL HIGH (ref 26.0–34.0)
MCV: 99.3 fL (ref 78.0–100.0)
Platelets: 220 10*3/uL (ref 150–400)
RBC: 4.22 MIL/uL (ref 3.87–5.11)
WBC: 4.8 10*3/uL (ref 4.0–10.5)

## 2013-09-21 LAB — POCT URINALYSIS DIPSTICK
Glucose, UA: NEGATIVE
Nitrite, UA: NEGATIVE
Protein, UA: NEGATIVE
Urobilinogen, UA: NEGATIVE

## 2013-09-21 LAB — HEMOGLOBIN, FINGERSTICK: Hemoglobin, fingerstick: 13.8 g/dL (ref 12.0–16.0)

## 2013-09-21 LAB — LIPID PANEL
Cholesterol: 199 mg/dL (ref 0–200)
VLDL: 38 mg/dL (ref 0–40)

## 2013-09-21 LAB — THYROID PANEL WITH TSH: T3 Uptake: 35.3 % (ref 22.5–37.0)

## 2013-09-21 MED ORDER — VALACYCLOVIR HCL 1 G PO TABS: 1.0000 g | ORAL_TABLET | Freq: Every day | ORAL | Status: DC

## 2013-09-21 MED ORDER — ACYCLOVIR 5 % EX OINT: TOPICAL_OINTMENT | CUTANEOUS | Status: DC

## 2013-09-21 NOTE — Progress Notes (Signed)
52 y.o. [id number] DivorcedCaucasianF here for annual exam.  On new antidepressant--Brintellix and hydroxyzine.  Seeing Dr. Nolen Mu.  Has a cycle every three to four months.  Having issues with depression.  Last saw her 2 weeks.  Seeing therapist weekly--Kathy Sherrine Maples (located near [facility] off of [address]).  Very frustrated with weight.  She feels that she is not eating much but then states she is eating cereal and oreos--comfort food.    Patient's last menstrual period was 08/03/2013.          Sexually active: no  The current method of family planning is none.    Exercising: no  not regularly Smoker:  no  Health Maintenance: Pap:  04/16/12 WNL/negative HR HPV History of abnormal Pap:  yes MMG:  08/13/12 normal-scheduled for today Colonoscopy:  2013 repeat in 3 years, Dr. Sherlene Shams BMD:   3/08 TDaP:  2002 Screening Labs: today, Hb today: 13.8, Urine today: RBC-trace   reports that she has never smoked. She has never used smokeless tobacco. She reports that she does not drink alcohol or use illicit drugs.  Past Medical History  Diagnosis Date  . Arthritis     lower back  . Anxiety   . Depression   . Thyroid disease     hypo  . Migraine   . Hx of migraines   . MVP (mitral valve prolapse) 2006  . HSV-2 infection   . Abnormal Pap smear 5/10    Glandular Cells   . HPV test positive   . Encounter for IUD removal 7/08    Past Surgical History  Procedure Laterality Date  . Gum surgery    . Breast enhancement surgery  1993  . Other surgical history  2001    Tummy Tuck  . Other surgical history  06/2007    Labial Reduction/ IUD insertion    Current Outpatient Prescriptions  Medication Sig Dispense Refill  . ALPRAZolam (XANAX) 0.5 MG tablet Take 0.5 mg by mouth daily.       Marland Kitchen HYDROXYZINE HCL PO Take 25 mg by mouth daily.      Marland Kitchen levothyroxine (SYNTHROID, LEVOTHROID) 50 MCG tablet Take 1 tablet (50 mcg total) by mouth daily.  90 tablet  3  . oxyCODONE-acetaminophen (PERCOCET)  10-325 MG per tablet Take 1 tablet by mouth 2 (two) times daily.      . traZODone (DESYREL) 100 MG tablet Take 25 mg by mouth at bedtime.      . Vortioxetine HBr (BRINTELLIX) 10 MG TABS Take by mouth daily.      . valACYclovir (VALTREX) 1000 MG tablet 1 g daily.      . Vitamin D, Ergocalciferol, (DRISDOL) 50000 UNITS CAPS capsule 50,000 Units once a week.      . [DISCONTINUED] CALCIUM CARBONATE PO Take 2 tablets by mouth daily.       No current facility-administered medications for this visit.    Family History  Problem Relation Age of Onset  . Colon cancer Mother   . Osteoporosis Mother   . Osteoporosis Maternal Grandmother     ROS:  Pertinent items are noted in HPI.  Otherwise, a comprehensive ROS was negative.  Exam:   BP 98/62  Pulse 80  Resp 16  Ht 5\' 9"  (1.753 m)  Wt 150 lb (68.04 kg)  BMI 22.14 kg/m2  LMP 08/03/2013  Weight change: @WEIGHTCHANGE @ Height:   Height: 5\' 9"  (175.3 cm)  Ht Readings from Last 3 Encounters:  09/21/13 5\' 9"  (1.753 m)  03/31/13 5\' 10"  (1.778 m)  10/17/12 5\' 10"  (1.778 m)    General appearance: alert, cooperative and appears stated age Head: Normocephalic, without obvious abnormality, atraumatic Neck: no adenopathy, supple, symmetrical, trachea midline and thyroid normal to inspection and palpation Lungs: clear to auscultation bilaterally Breasts: normal appearance, no masses or tenderness, with bilateral implants Heart: regular rate and rhythm Abdomen: soft, non-tender; bowel sounds normal; no masses,  no organomegaly Extremities: extremities normal, atraumatic, no cyanosis or edema Skin: Skin color, texture, turgor normal. No rashes or lesions Lymph nodes: Cervical, supraclavicular, and axillary nodes normal. No abnormal inguinal nodes palpated Neurologic: Grossly normal   Pelvic: External genitalia:  no lesions              Urethra:  normal appearing urethra with no masses, tenderness or lesions              Bartholins and Skenes:  normal                 Vagina: normal appearing vagina with normal color and discharge, no lesions              Cervix: no lesions              Pap taken: yes Bimanual Exam:  Uterus:  normal size, contour, position, consistency, mobility, non-tender              Adnexa: normal adnexa and no mass, fullness, tenderness               Rectovaginal: Confirms               Anus:  normal sphincter tone, no lesions  A:  Well Woman with normal exam Depression Osteoporosis in mother  P:   Mammogram scheduled today. pap smear today.  H/O neg HR HPV/neg Pap 2013.  Pt and I discussed guidelines.  Requests yearly Pap smear CMP, Lipids, TSH and panel, Vit D, cbc Will need RX for Synthroid pending results of labs. Tdap today. return annually or prn  An After Visit Summary was printed and given to the patient.

## 2013-09-21 NOTE — Patient Instructions (Signed)

## 2013-09-23 ENCOUNTER — Telehealth: Payer: Self-pay | Admitting: Emergency Medicine

## 2013-09-23 MED ORDER — LEVOTHYROXINE SODIUM 75 MCG PO TABS: 75.0000 ug | ORAL_TABLET | Freq: Every day | ORAL | Status: DC

## 2013-09-23 NOTE — Telephone Encounter (Signed)
Spoke with patient. Message from Dr. Hyacinth Meeker given.   Patient was concerned regarding elevated triglycerides. Advised will monitor per message from Dr. Hyacinth Meeker and discussed healthy lifestyle changes, avoid sugary foods, choose monounsaturated fats.   Patient agreeable and verbalized understanding.   She declined to make appointment for lab at this time. Will call back when she knows her schedule.

## 2013-09-23 NOTE — Addendum Note (Signed)
Addended by: Jerene Bears on: 09/23/2013 09:27 AM   Modules accepted: Orders

## 2013-09-23 NOTE — Telephone Encounter (Signed)
Message copied by Joeseph Amor on Wed Sep 23, 2013 11:23 AM ------      Message from: Jerene Bears      Created: Wed Sep 23, 2013  9:25 AM       Please inform labs fine except triglycerides are elevated.  This can be followed and I will recheck one year.  Her TSH is elevated meaning she is not getting enough thyroid medication.  On daily.  Needs to increase to daily.  She can take 1 1/2 of what she has until it is out and I did a new RX for her.  Needs repeat TSH 3 months.  Order placed.  Please put on lab scheduled. ------

## 2013-10-26 ENCOUNTER — Telehealth: Payer: Self-pay | Admitting: Obstetrics & Gynecology

## 2013-10-26 NOTE — Telephone Encounter (Signed)
Patients is asking for results from a few months ago.

## 2013-10-26 NOTE — Telephone Encounter (Signed)
Spoke with patient. She states she got a letter in the mail stating she needed to talk to her doctor about her results, but could not tell me what the letter said or what lab they were referencing. I spoke with patient on 10/22 regarding labs and discussed message from Dr. Hyacinth Meeker. Advised that all labs were discussed and reviewed by Dr. Hyacinth Meeker. Advised patient if she locates letter I could better help patient.  Patient states she has been gaining weight and wondering if it is r/t to hypothyroidism, and I advised yes it could be related to that. Patient thought she should increased her levothyroxine more than previously suggested, I advised not to do to that unless specifically directed to by a physician. I advised it is important to have follow up at the end of January for labs so that Dr. Hyacinth Meeker can evaluate if increased dose of levothyroxine has helped. Patient again declines to schedule lab appointment, states she will call back when it is closer to January so that she can schedule.

## 2013-11-23 ENCOUNTER — Other Ambulatory Visit: Payer: Self-pay | Admitting: Physical Medicine & Rehabilitation

## 2013-12-23 ENCOUNTER — Other Ambulatory Visit: Payer: Self-pay | Admitting: Obstetrics & Gynecology

## 2013-12-23 ENCOUNTER — Ambulatory Visit: Payer: BC Managed Care – PPO

## 2013-12-23 DIAGNOSIS — E039 Hypothyroidism, unspecified: Secondary | ICD-10-CM

## 2013-12-23 LAB — TSH: TSH: 1.572 u[IU]/mL (ref 0.350–4.500)

## 2013-12-28 LAB — LIPID PANEL
Cholesterol: 213 mg/dL — ABNORMAL HIGH (ref 0–200)
HDL: 63 mg/dL (ref >39)
LDL Cholesterol: 117 mg/dL — ABNORMAL HIGH (ref 0–99)
Total CHOL/HDL Ratio: 3.4 Ratio
Triglycerides: 163 mg/dL — ABNORMAL HIGH (ref <150)
VLDL: 33 mg/dL (ref 0–40)

## 2013-12-28 LAB — HEMOGLOBIN, FINGERSTICK: HEMOGLOBIN, FINGERSTICK: 13.7 g/dL (ref 12.0–16.0)

## 2013-12-29 LAB — VITAMIN D 25 HYDROXY (VIT D DEFICIENCY, FRACTURES): Vit D, 25-Hydroxy: 42 ng/mL (ref 30–89)

## 2013-12-31 ENCOUNTER — Telehealth: Payer: Self-pay

## 2013-12-31 NOTE — Telephone Encounter (Signed)
Message copied by Robley Fries on Thu Dec 31, 2013  1:40 PM ------      Message from: Megan Salon      Created: Wed Dec 30, 2013  4:00 PM       Inform Cholesterol is mildly elevated but not in a range that needs treatement as HDLs are good.  Repeat 1 year.  TSH nl.  Vit D fine. ------

## 2013-12-31 NOTE — Telephone Encounter (Signed)
Lmtcb//kn 

## 2013-12-31 NOTE — Telephone Encounter (Signed)
Patient notified of all results. 

## 2014-06-03 ENCOUNTER — Ambulatory Visit (INDEPENDENT_AMBULATORY_CARE_PROVIDER_SITE_OTHER): Payer: BC Managed Care – PPO | Admitting: Certified Nurse Midwife

## 2014-06-03 ENCOUNTER — Telehealth: Payer: Self-pay | Admitting: Obstetrics & Gynecology

## 2014-06-03 ENCOUNTER — Encounter: Payer: Self-pay | Admitting: Certified Nurse Midwife

## 2014-06-03 VITALS — BP 104/64 | HR 64 | Resp 16 | Ht 69.0 in | Wt 157.0 lb

## 2014-06-03 DIAGNOSIS — N644 Mastodynia: Secondary | ICD-10-CM

## 2014-06-03 NOTE — Telephone Encounter (Signed)
Spoke with patient. She c/o a palpable area on her L rib cage for last 3-4 months. She felt this was r/t recent weight gain over time and thought it would resolve on its own.  Office visit offered for today with Regina Eck CNM (okay with Dr. Sabra Heck) for 1600. She is agreeable to this.   Routing to provider for final review. Patient agreeable to disposition. Will close encounter

## 2014-06-03 NOTE — Progress Notes (Signed)
   Subjective:   53 y.o. DivorcedCaucasian female presents for evaluation of left breast tenderness. Onset of the symptoms was  ? 2 months ago. Patient sought evaluation because of tenderness under the breast and on the side,patients has saline implants and does not feel she can check well.  Contributing factors include PGM had breast cancer at a late age.. Denies no other health issues.  Patient denies hiistory of trauma, bites, or injuries. Last mammogram was 10/14 with 3 D mammogram which was normal.. Patient has obtained new underwire bras for support and also has a daughter who plays adult volleyball and runs and squeezes her and lifts her quite frequently. Patient actually feels tenderness in rib cage area often after hugs like this. No caffeine use, no abnormal mammogram history. Review of Systems Pertinent items are noted in HPI.   Objective:   General appearance: alert, cooperative and no distress Breasts: normal appearance, no masses or tenderness, No nipple retraction or dimpling, No nipple discharge or bleeding, No axillary or supraclavicular adenopathy, patient points to area at outside of left breast that becomes tender at times, underwire marks are noted. Patient was concerned about lump outside breast area underneath. Ribs palpated, not tender, no masses. Patient says this is the area she was concerned about. Patient pointed to area and rib was palpated, no masses or tenderness noted.    Assessment:   Normal breast exam with implants palpated and appear intact. Breast tenderness probably related to underwire compression and daughter squeezing mom. Ribs noted as area of concern without masses noted or tenderness.   Plan:  Discussed findings of normal breast exam. Discussed findings with patient and patient agrees after palpation that it was a rib she felt. Encouraged patient to remove underwire in bra and see this resolves any discomfort she has noted. Encourage daughter not to  squeeze so hard with hugs. Discussed diagnostic mammogram for implant change and reassurance no problems. Patient relieved that this will also be done. Patient will be called with appointment, but was advised it would be Monday before this would be scheduled and do not feel it is emergent. She agrees also.  Rv prn and as above

## 2014-06-03 NOTE — Telephone Encounter (Signed)
Patient calling to schedule an appointment for a "lump near her ribcage" or her left breast. Please advise.

## 2014-06-07 ENCOUNTER — Telehealth: Payer: Self-pay

## 2014-06-07 ENCOUNTER — Other Ambulatory Visit: Payer: Self-pay | Admitting: Certified Nurse Midwife

## 2014-06-07 DIAGNOSIS — N644 Mastodynia: Secondary | ICD-10-CM

## 2014-06-07 NOTE — Telephone Encounter (Signed)
Spoke with the [facility]. Appointment rescheduled for July 14th at 1:15pm. Left message at patient's number provided okay per patient and per ROI. Advised patient that appointment is rescheduled for July 14th at 1:15pm. Advised to call back with any further questions.   Routing to provider for final review. Patient agreeable to disposition. Will close encounter

## 2014-06-07 NOTE — Telephone Encounter (Signed)
Message copied by Jasmine Awe on Mon Jun 07, 2014 10:55 AM ------      Message from: Regina Eck      Created: Thu Jun 03, 2014  5:22 PM       Please schedule diagnostic bilateral mammogram on this patient for left breast tenderness and implant status. Order to Breast center. I told her we would call her with appointment on Monday 6, 2015. ------

## 2014-06-07 NOTE — Telephone Encounter (Signed)
Spoke with the [place]. Scheduled patient for first available appointment on July 14th at 8:30am. Spoke with patient. Patient states that she will be unable to make that appointment as she is unavailable in the mornings. Patient requests afternoon appointment. Advised would call back over to the breast center and reschedule. Patient requesting voicemail be left at number provided as she will be in a meeting.

## 2014-06-11 NOTE — Progress Notes (Signed)
Reviewed personally.  M. Suzanne Janelie Goltz, MD.  

## 2014-06-15 ENCOUNTER — Other Ambulatory Visit: Payer: BC Managed Care – PPO

## 2014-06-15 ENCOUNTER — Other Ambulatory Visit: Payer: Self-pay | Admitting: Certified Nurse Midwife

## 2014-06-15 ENCOUNTER — Ambulatory Visit
Admission: RE | Admit: 2014-06-15 | Discharge: 2014-06-15 | Disposition: A | Discharge status: Home / Self Care | Payer: BC Managed Care – PPO | Source: Ambulatory Visit | Attending: Certified Nurse Midwife | Admitting: Certified Nurse Midwife

## 2014-06-15 DIAGNOSIS — N644 Mastodynia: Secondary | ICD-10-CM

## 2014-06-22 ENCOUNTER — Telehealth: Payer: Self-pay | Admitting: Emergency Medicine

## 2014-06-22 NOTE — Telephone Encounter (Signed)
Message copied by Michele Mcalpine on Tue Jun 22, 2014 11:48 AM ------      Message from: Regina Eck      Created: Thu Jun 17, 2014  7:59 AM       Reviewed [place] and mammogram report of left breast which tenderness is probable with history and findings, but report indicates a mass in right breast and no further recommendations or comments. Please clarify with breast center before patient called. And decision for recall per 3 months  Per report entered. ------

## 2014-06-22 NOTE — Telephone Encounter (Signed)
[place] and spoke with [place]. She is sending imaging report to Skipper Cliche M.D. For review and addendum if necessary. This provider is not scheduled to be in office for approximately two weeks so will hold until addendum.

## 2014-06-29 NOTE — Telephone Encounter (Signed)
Thank you :)

## 2014-07-29 ENCOUNTER — Telehealth: Payer: Self-pay | Admitting: Emergency Medicine

## 2014-07-29 NOTE — Telephone Encounter (Signed)
Called again and spoke with [place]. Dr. Skipper Cliche is not available and will not be until 08/13/14. She will contact a radiologist in office and see if addendum can be made.

## 2014-07-29 NOTE — Telephone Encounter (Signed)
Encounter opened erroneously.   Closed encounter.   

## 2014-08-03 ENCOUNTER — Telehealth: Payer: Self-pay | Admitting: Obstetrics & Gynecology

## 2014-08-03 NOTE — Telephone Encounter (Signed)
Dr.Miller, patient had diagnostic breast imaging done on 7/14. We are waiting on addendum of results from the radiologist in regards to her right breast results see telephone encounter from 7/21. However, patient has now discovered a lump in her left breast and prefers to see you. Please advise.   Spoke with patient advised I have sent a message to [facility] and will return call as soon as possible once I hear back from her with further recommendations and appointment availability. Patient agreeable.

## 2014-08-03 NOTE — Telephone Encounter (Signed)
Patient found a breast lump. Please call ASAP.

## 2014-08-03 NOTE — Telephone Encounter (Signed)
Spoke with patient. Advised we can work patient in on Thursday morning per Dr.Miller for her to be seen. Patient agreeable. Appointment scheduled for Thursday 9/3 at 9:45am (time per [place]). Patient agreeable to date and time.  Routing to provider for final review. Patient agreeable to disposition. Will close encounter

## 2014-08-03 NOTE — Telephone Encounter (Signed)
Spoke with patient. Patient states that she had a mammogram and ultrasound a month ago which was normal. Today she discovered a lump in her left breast close to the nipple. States that the lump is around the size of "my thumb print." Patient is requesting that someone take another look at her results. Patient is teary stating "I just don't see how this could be missed and how it came up so quickly." Advised patient that I will have Regina Eck CNM take a look at results again and give patient a call back with further recommendations. Advised that since this is a new finding it will be best that patient come in to be seen. Patient would like me to speak with and MD and call her back. Requests I speak with Dr.Miller. Advised Dr.Miller is currently out of the office until later today but that I will speak with a covering MD and call her back. Patient agreeable.

## 2014-08-05 ENCOUNTER — Encounter: Payer: Self-pay | Admitting: Obstetrics & Gynecology

## 2014-08-05 ENCOUNTER — Ambulatory Visit (INDEPENDENT_AMBULATORY_CARE_PROVIDER_SITE_OTHER): Payer: BC Managed Care – PPO | Admitting: Obstetrics & Gynecology

## 2014-08-05 VITALS — BP 98/60 | HR 60 | Resp 12 | Wt 158.2 lb

## 2014-08-05 DIAGNOSIS — E039 Hypothyroidism, unspecified: Secondary | ICD-10-CM

## 2014-08-05 DIAGNOSIS — N63 Unspecified lump in unspecified breast: Secondary | ICD-10-CM

## 2014-08-05 DIAGNOSIS — N6320 Unspecified lump in the left breast, unspecified quadrant: Secondary | ICD-10-CM

## 2014-08-05 LAB — TSH: TSH: 4.817 u[IU]/mL — AB (ref 0.350–4.500)

## 2014-08-05 NOTE — Progress Notes (Signed)
Subjective:     Patient ID: Nancy Glenn, female   DOB: 07/27/1961, 53 y.o.   MRN: [id number]  HPI 53 yo G3P3 DWF here for new finding of breast mass/lump that she noticed two days ago.  Denies tenderness or pain.  No nipple discharge.  Was seen 06/03/14 for breast pain by CNM, [place] Ana, and diagnostic MMG with ultrasound on left was done on 06/15/14.  This showed several small cysts in the left breast.  Notation on breast ultrasound reads:  "Ultrasound is performed, showing numerous simple cysts in the superior periareolar left breast. The largest simple cyst measures 14 mm. There are no abnormalities in the 4 o'clock through 8 o'clock position."  Reviewed this with pt.  Pt has questions about her synthroid.  Still feels tired.  On 74mcg.  TSH normal in January.  Feels like she is tolerating the medicatoin fine.    Review of Systems  All other systems reviewed and are negative.      Objective:   Physical Exam  Constitutional: She is oriented to person, place, and time. She appears well-developed and well-nourished.  Pulmonary/Chest: Right breast exhibits no inverted nipple, no nipple discharge, no skin change and no tenderness. Left breast exhibits mass. Left breast exhibits no inverted nipple, no nipple discharge, no skin change and no tenderness. Breasts are symmetrical.    Neurological: She is alert and oriented to person, place, and time.  Skin: Skin is warm and dry.  Psychiatric: She has a normal mood and affect.       Assessment:     Probable breast cyst  Hypothyroidism   Plan:     Recheck one month for repeat exam.  As MMG with ultrasound just done about six weeks ago, I do not feel repeat imaging is necessary at this time.  TSH obtained today.  May need referral to endocrinologist

## 2014-08-10 ENCOUNTER — Telehealth: Payer: Self-pay | Admitting: Obstetrics & Gynecology

## 2014-08-10 DIAGNOSIS — N6320 Unspecified lump in the left breast, unspecified quadrant: Secondary | ICD-10-CM

## 2014-08-10 NOTE — Telephone Encounter (Signed)
Patient returned call. She states that she was thinking about waiting to do have imaging done, but she has changed her mind and would like to have imaging done again. Patient states she is worried and just doesn't think she can wait a month for recheck.  Advised would need to obtain orders from Dr. Sabra Heck.  Advised would return her call.

## 2014-08-10 NOTE — Telephone Encounter (Signed)
Message left to return call to Shreshta Medley at [phone].    

## 2014-08-10 NOTE — Telephone Encounter (Signed)
Pt needs order for mammogram sent to the breast center on [address]

## 2014-08-11 MED ORDER — LEVOTHYROXINE SODIUM 88 MCG PO TABS: 88.0000 ug | ORAL_TABLET | Freq: Every day | ORAL | Status: DC

## 2014-08-11 NOTE — Telephone Encounter (Signed)
Spoke with patient and message given.  Appointments for [place] given.  Patient will call back to schedule lab appointment. Patient verbalized understanding of change of synthroid.  Routing to provider for final review. Patient agreeable to disposition. Will close encounter

## 2014-08-11 NOTE — Telephone Encounter (Signed)
Message left to return call to [place] at [phone].    Scheduled L Dx Mammogram with implants with L Breast Ultrasound for 08/19/14 at 2:45.  Patient needs messages from Dr. Sabra Heck below as well.

## 2014-08-11 NOTE — Telephone Encounter (Signed)
Message copied by Michele Mcalpine on Wed Aug 11, 2014  9:05 AM ------      Message from: Megan Salon      Created: Wed Aug 11, 2014  8:50 AM       Inform TSH is a little on the high side.  I would like to increase her synthroid.  She is on 5mcg.  She needs to increase to 39mcg.  Repeat TSH 3 months.  Orders for both medication change and TSh placed. ------

## 2014-08-11 NOTE — Addendum Note (Signed)
Addended by: Megan Salon on: 08/11/2014 08:51 AM   Modules accepted: Orders

## 2014-08-11 NOTE — Telephone Encounter (Signed)
I just signed off on her TSH which I think I sent to [place], [place].  Was mildly elevated and she needs to increase her synthroid and have repeat labs 3 months.  If still elevated, will recommend endocrinology referral at that point.  Orders placed for this and medication change already placed.   Needs diagnostic MMG with ultrasound of left breast.

## 2014-08-19 ENCOUNTER — Ambulatory Visit
Admission: RE | Admit: 2014-08-19 | Discharge: 2014-08-19 | Disposition: A | Discharge status: Home / Self Care | Payer: BC Managed Care – PPO | Source: Ambulatory Visit | Attending: Obstetrics & Gynecology | Admitting: Obstetrics & Gynecology

## 2014-08-19 ENCOUNTER — Telehealth: Payer: Self-pay | Admitting: Obstetrics & Gynecology

## 2014-08-19 ENCOUNTER — Telehealth: Payer: Self-pay | Admitting: Emergency Medicine

## 2014-08-19 DIAGNOSIS — N6320 Unspecified lump in the left breast, unspecified quadrant: Secondary | ICD-10-CM

## 2014-08-19 NOTE — Telephone Encounter (Signed)
Patient is going today for additional evaluation of L Breast. Message left on Stasia Cavalier, Clinical Manager of [place] for return call to discuss addendum for patient.

## 2014-08-19 NOTE — Telephone Encounter (Signed)
Patient calling. She has dx imaging scheduled for today of L Breast.  Patient requesting that both breasts be done as she is due for screening Mammogram.  Advised patient that last screening Mammogram was done on 09/23/13 so she would be too early for a screening and that she would need diagnostic imaging today only of L Breast,unless radiologist at [facility] decides differently.

## 2014-08-19 NOTE — Telephone Encounter (Signed)
error 

## 2014-09-07 ENCOUNTER — Telehealth: Payer: Self-pay | Admitting: Obstetrics & Gynecology

## 2014-09-07 NOTE — Telephone Encounter (Signed)
Routing message from 08/19/14 to Dr. Sabra Heck for review and signature. Will close encounter.

## 2014-09-07 NOTE — Telephone Encounter (Signed)
Pt is asking about her previous lab work was. Says she thinks she may be diabetic. Says that she is having all the symptoms of it.

## 2014-09-07 NOTE — Telephone Encounter (Signed)
Message left to return call to [place] at [phone].   Patient does not have recent HgbA1C or Chemistry, however, she is due for TSH recheck in December.  Will triage. Does patient have pcp?

## 2014-09-08 NOTE — Telephone Encounter (Signed)
Spoke with patient. Patient prefers to keep aex scheduled for 11/16 with Dr.Miller. Office visit scheduled to see Milford Cage, FNP for symptoms and lab work tomorrow at 2:15pm. Patient is agreeable to date and time and will keep appointment for aex with Dr.Miller.   Routing to Dr.Silva as covering and helping with patient Cc: Dr.Miller Cc: Milford Cage, FNP  Routing to provider for final review. Patient agreeable to disposition. Will close encounter

## 2014-09-08 NOTE — Telephone Encounter (Signed)
Patient needs office visit and not just labs.  She is actually due in one week for her routine annual exam.  If unable to see Dr. Sabra Heck, please schedule with Patty or Debbie. Thank you!

## 2014-09-08 NOTE — Telephone Encounter (Signed)
Spoke with patient. Advised patient we do not have a recent HgbA1c for her and that she will be due for TSH check in December. Patient does not have PCP. Patient states that for a few months she has been experiencing "tingling" in her fingers that happens randomly. Patient also states she has been having extreme fatigue, increased thirst, and increased urinary output. "I go to the bathroom around 30 times a day. I know I am putting out more than I am taking in." Denies any other urinary symptoms such as burning and pressure. Denies back pain and fevers. Patient would like to have blood work done in our office to check everything. Advised patient would send a message to the covering provider for further recommendations and give her a call back. Patient is agreeable.  Dr.Silva, okay for patient to come in for HgbA1c? Any other labs that should be checked?

## 2014-09-08 NOTE — Telephone Encounter (Signed)
Patient may also see her PCP prior to visit with Dr. Sabra Heck if she chooses.

## 2014-09-09 ENCOUNTER — Ambulatory Visit (INDEPENDENT_AMBULATORY_CARE_PROVIDER_SITE_OTHER): Payer: BC Managed Care – PPO | Admitting: Nurse Practitioner

## 2014-09-09 ENCOUNTER — Encounter: Payer: Self-pay | Admitting: Nurse Practitioner

## 2014-09-09 VITALS — BP 90/66 | HR 64 | Ht 69.0 in | Wt 156.0 lb

## 2014-09-09 DIAGNOSIS — E039 Hypothyroidism, unspecified: Secondary | ICD-10-CM

## 2014-09-09 DIAGNOSIS — N912 Amenorrhea, unspecified: Secondary | ICD-10-CM

## 2014-09-09 DIAGNOSIS — R350 Frequency of micturition: Secondary | ICD-10-CM

## 2014-09-09 LAB — POCT URINALYSIS DIPSTICK
BILIRUBIN UA: NEGATIVE
Glucose, UA: NEGATIVE
KETONES UA: NEGATIVE
Leukocytes, UA: NEGATIVE
Nitrite, UA: NEGATIVE
Urobilinogen, UA: NEGATIVE
pH, UA: 6

## 2014-09-09 NOTE — Progress Notes (Signed)
Patient ID: Nancy Glenn, female   DOB: Oct 05, 1961, 53 y.o.   MRN: [id number] S: This 53 yo WD Fe G3, P3  Presents for consultation about symptoms of increased urination for over a year.  She consumes a lot of fluids but feels that her urination exceeds that amount.  Goes to bathroom 10-12 times per hour.  Nocturia X 3.  [place]:  MGM with history of DM. Denies symptoms of frequent vaginitis, increased thirst, weight loss/ gain.  She does have some episodes of maybe hypoglycemia.  Admits to a poor diet and eating a lot of fast foods.  She consumes a lot of sugar with candy and chocolate.   Some age related decrease in vision and last eye exam was 6 months ago.  She is postmenopausal with LMP  08/03/2013.  She is on thyroid replacement and was to have a repeat TSH now.  She thinks her hormones are out of 'balance'.  She has a long history of physical and emotional issues.  She is not employed and does not leave the house until she has to.  Also on chronic pain med's.   Assessment: [place]: MGM with DM   History of polyuria   R/O UTI as cause   History of hypothyroid on replacement   Plan:  Check hormone levels with TSH, FSH   Check HGB AIC   Check urine for C&S, Micro and follow  Consult time: 18 minutes

## 2014-09-10 LAB — URINALYSIS, MICROSCOPIC ONLY
Casts: NONE SEEN
Crystals: NONE SEEN

## 2014-09-10 LAB — TSH: TSH: 1.181 u[IU]/mL (ref 0.350–4.500)

## 2014-09-10 LAB — HEMOGLOBIN A1C
Hgb A1c MFr Bld: 5.4 % (ref <5.7)
MEAN PLASMA GLUCOSE: 108 mg/dL (ref <117)

## 2014-09-10 LAB — FOLLICLE STIMULATING HORMONE: FSH: 68.2 m[IU]/mL

## 2014-09-10 NOTE — Progress Notes (Signed)
Reviewed personally.  M. Suzanne Jaydyn Bozzo, MD.  

## 2014-09-11 LAB — URINE CULTURE
Colony Count: NO GROWTH
Organism ID, Bacteria: NO GROWTH

## 2014-09-13 ENCOUNTER — Telehealth: Payer: Self-pay | Admitting: Nurse Practitioner

## 2014-09-13 NOTE — Telephone Encounter (Signed)
Spoke with patient. Advised patient Milford Cage, FNP is still seeing patient's and that we would love to get her in. Advised of appointment openings tomorrow with Milford Cage, FNP. Patient is agreeable and would like to schedule. Appointment scheduled for tomorrow at 11:15am with Milford Cage, [place]. Patient is agreeable to date and time.  Routing to provider for final review. Patient agreeable to disposition. Will close encounter

## 2014-09-13 NOTE — Telephone Encounter (Signed)
Left message we need [place] [employer] authorization for visit 09/14/14.

## 2014-09-13 NOTE — Telephone Encounter (Signed)
Pt says she has some questions for nurse about her appt last week

## 2014-09-13 NOTE — Telephone Encounter (Signed)
Spoke with patient. Patient states that when she was here for appointment last week no one listened to her heart. States that her daughter who is a PA listened to it when she came into town this weekend and "She is hearing an extra sound in the pulmonic valve." Patient would like to return for Nancy Cage, FNP to listen to her heart today before her daughter goes back home. Advised will have to speak with Nancy Cage, FNP about scheduling and return call to patient. Patient is agreeable.

## 2014-09-14 ENCOUNTER — Ambulatory Visit (INDEPENDENT_AMBULATORY_CARE_PROVIDER_SITE_OTHER): Payer: BC Managed Care – PPO | Admitting: Nurse Practitioner

## 2014-09-14 ENCOUNTER — Encounter: Payer: Self-pay | Admitting: Nurse Practitioner

## 2014-09-14 VITALS — BP 110/64 | HR 72 | Ht 69.0 in | Wt 156.0 lb

## 2014-09-14 DIAGNOSIS — I499 Cardiac arrhythmia, unspecified: Secondary | ICD-10-CM

## 2014-09-14 NOTE — Progress Notes (Signed)
Attempted to review medications, allergies and history, but patient states she was just "here last week and nothing has changed."

## 2014-09-15 ENCOUNTER — Encounter: Payer: Self-pay | Admitting: Nurse Practitioner

## 2014-09-15 ENCOUNTER — Telehealth: Payer: Self-pay | Admitting: Cardiovascular Disease

## 2014-09-15 NOTE — Telephone Encounter (Signed)
Patient  Was told by gynecologist that  She has tricuspid murmur/insufficiency with some irregular beats. She had been worked up for fatigue and confusion. Saw Dr. Johnsie Cancel 2 years ago with chest pain. Since it is so late today, i advised her we would call back tomorrow to try to work her in with covering PA. She is agreeable to this plan.

## 2014-09-15 NOTE — Progress Notes (Signed)
Patient ID: Nancy Glenn, female   DOB: 15-Mar-1961, 53 y.o.   MRN: [id number] S:   This 53 yo WD Fe presents as a follow up on last weeks visit.  She was here for a consult and lab appointment to discuss her increase in urination and concerns about diabetes.  Her daughter who is a PA comes home and listens to her mothers heart.  She now presents here with the daughter who is upset that we did not listen to the mothers heart at last visit.  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    Assessment:  Patient is in no distress.  Lungs are clear.  There is a normal sinus rhythm with 1 episode of a PAC.  The split S1/2 is not heard.   Plan: First of all I reviewed the patient recent labs (with patient permission) - and looked at the patient dietary choices as the reason for emotional and physical changes.  Her diet is high in sugar and does not consume much protein or regular meals.  We also discussed the increase in urinary frequency - but patient states this is not new and has always done this.  Another reasonable explanation is that she has a small bladder capacity. As far as the heart arrythmia I have advised that she seek PCP and or cardiology evaluation.  The patient has seen a cardiologist about 3-4 years ago and feels she can make her own appointment.  The daughter wants [place] to do an EKG - explained that is not within our scope of practice and we do not have an EKG machine.  She is also scheduled with Dr. Sabra Heck for a AEX within the next several weeks.   Exam and consult time with patient and daughter was at least 30 minutes- face to face.

## 2014-09-15 NOTE — Telephone Encounter (Signed)
New problem   Pt went to pcp and was told she has irregular heartbeat. Pt very nervous and want to talk to nurse. Please call pt.

## 2014-09-16 ENCOUNTER — Encounter: Payer: Self-pay | Admitting: Physician Assistant

## 2014-09-16 ENCOUNTER — Ambulatory Visit (INDEPENDENT_AMBULATORY_CARE_PROVIDER_SITE_OTHER): Payer: BC Managed Care – PPO | Admitting: Physician Assistant

## 2014-09-16 VITALS — BP 88/58 | HR 79 | Ht 69.0 in | Wt 157.4 lb

## 2014-09-16 DIAGNOSIS — R9431 Abnormal electrocardiogram [ECG] [EKG]: Secondary | ICD-10-CM

## 2014-09-16 DIAGNOSIS — I341 Nonrheumatic mitral (valve) prolapse: Secondary | ICD-10-CM

## 2014-09-16 DIAGNOSIS — R5383 Other fatigue: Secondary | ICD-10-CM

## 2014-09-16 DIAGNOSIS — R0602 Shortness of breath: Secondary | ICD-10-CM

## 2014-09-16 DIAGNOSIS — E039 Hypothyroidism, unspecified: Secondary | ICD-10-CM

## 2014-09-16 DIAGNOSIS — Z87898 Personal history of other specified conditions: Secondary | ICD-10-CM | POA: Insufficient documentation

## 2014-09-16 DIAGNOSIS — Z8679 Personal history of other diseases of the circulatory system: Secondary | ICD-10-CM

## 2014-09-16 LAB — BASIC METABOLIC PANEL
BUN: 14 mg/dL (ref 6–23)
CALCIUM: 9.5 mg/dL (ref 8.4–10.5)
CO2: 31 mEq/L (ref 19–32)
CREATININE: 0.9 mg/dL (ref 0.4–1.2)
Chloride: 103 mEq/L (ref 96–112)
GFR: 70.43 mL/min (ref >60.00)
Glucose, Bld: 95 mg/dL (ref 70–99)
Potassium: 4.7 mEq/L (ref 3.5–5.1)
Sodium: 138 mEq/L (ref 135–145)

## 2014-09-16 LAB — CBC
HCT: 42.8 % (ref 36.0–46.0)
Hemoglobin: 14.1 g/dL (ref 12.0–15.0)
MCHC: 32.9 g/dL (ref 30.0–36.0)
MCV: 98.3 fl (ref 78.0–100.0)
PLATELETS: 207 10*3/uL (ref 150.0–400.0)
RBC: 4.36 Mil/uL (ref 3.87–5.11)
RDW: 12.7 % (ref 11.5–15.5)
WBC: 4.9 10*3/uL (ref 4.0–10.5)

## 2014-09-16 NOTE — Telephone Encounter (Signed)
Spoke to [employer]. Pt to be added to schedule with EKG today. Left Ms. Holsworth message to call office back.

## 2014-09-16 NOTE — Telephone Encounter (Signed)
Pt returned call. St she is extremely fatigued and needs to be seen. Appointment made for 1100 with Melina Copa.

## 2014-09-16 NOTE — Patient Instructions (Signed)
Your physician recommends that you continue on your current medications as directed. Please refer to the Current Medication list given to you today.   Your physician has requested that you have en exercise stress myoview. For further information please visit [email or web address]. Please follow instruction sheet, as given.   Your physician has requested that you have an echocardiogram. Echocardiography is a painless test that uses sound waves to create images of your heart. It provides your doctor with information about the size and shape of your heart and how well your heart's chambers and valves are working. This procedure takes approximately one hour. There are no restrictions for this procedure.   Your physician recommends that you return for lab work TODAY BMET [place] TEST     FUTURE LAB : [facility]   Your physician recommends that you schedule a follow-up appointment in:  [place]

## 2014-09-16 NOTE — Progress Notes (Addendum)
[facility], [place] [address] [place], [place]  [address] Phone: [phone] Fax:  [phone]  Date:  09/16/2014   Patient ID:  Nancy Glenn, DOB Oct 05, 1961, MRN [id number]   PCP:  No PCP Per Patient  Cardiologist:  Johnsie Cancel   History of Present Illness: Nancy Glenn is a 53 y.o. female with history of anxiety, palpitations and reported mitral valve prolapse during remote pregnancy. She saw Dr. Johnsie Cancel in 2013 for palpitations and chest pain with normal EKG and normal ETT at that time. He felt her symptoms were atypical and benign. There is no prior echo on file to document her mitral valve prolapse. [facility]. Per their notes, "Her daughter who is a PA [student] comes home and listens to her mothers heart. She now presents here with the daughter who is upset that we did not listen to the mothers heart at last visit. 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 She was referred to our clinic for further evaluation and added onto the flex schedule. Review of recent labs indicates normal TSH and normal A1C on 09/09/14. FSH was consistent with known menopausal state. UA showed large Hgb and trace protein, otherwise negative - microscopic analysis did not show any significant RBC. LDL in 12/2013 was 117.  She reports significant fatigue for the last year. This is impeding her ability to participate in normal activities. She just feels like she has no energy. She gets SOB when she goes up stairs. She does not participate in any regular exercise. She has intermittent fleeting chest discomfort like a "caught" feeling in her chest particularly when she lays on her left side. The chest pain does not occur with exertion. She sometimes feels her heart skip a beat. She also  reports occasional nausea and vomiting for no particular reason. She has frequent urination. Appetite has never been robust. No significant recent weight changes. No reported bleeding, syncope, or LEE. She reports she is up to date on mammogram and colonoscopy.   Recent Labs: 09/21/2013: ALT 18; Creatinine 0.97; Hemoglobin 14.8; Potassium 5.2  12/23/2013: HDL Cholesterol by NMR 63; LDL (calc) 117*  09/09/2014: TSH 1.181   Wt Readings from Last 3 Encounters:  09/16/14 157 lb 6.4 oz (71.396 kg)  09/14/14 156 lb (70.761 kg)  09/09/14 156 lb (70.761 kg)     Past Medical History  Diagnosis Date  . Arthritis     lower back  . Anxiety   . Depression   . Hypothyroidism   . Hx of migraines   . MVP (mitral valve prolapse)     a. With remote pregnancy per patient.  Marland Kitchen HSV-2 infection   . Abnormal Pap smear 5/10    Glandular Cells   . HPV test positive   . Normal cardiac stress test     a. Normal ETT 10/2012.  Marland Kitchen History of palpitations     Current Outpatient Prescriptions  Medication Sig Dispense Refill  . ALPRAZolam (XANAX) 0.5 MG tablet Take 0.5 mg by mouth daily.       Marland Kitchen levothyroxine (SYNTHROID) 88 MCG tablet Take 1 tablet (88 mcg total) by mouth daily. One po qd  30 tablet  2  . Oxycodone HCl 10 MG TABS Take 1 tablet  by mouth every 6 (six) hours.      . Vitamin D, Ergocalciferol, (DRISDOL) 50000 UNITS CAPS capsule 50,000 Units once a week.      . Vortioxetine HBr 10 MG TABS Take 5 mg by mouth daily.       . [DISCONTINUED] CALCIUM CARBONATE PO Take 2 tablets by mouth daily.       No current facility-administered medications for this visit.    Allergies:   Sulfa antibiotics   Social History:  The patient  reports that she has never smoked. She has never used smokeless tobacco. She reports that she does not drink alcohol or use illicit drugs.   Family History:  The patient's family history includes Breast cancer (age of onset: 41) in her paternal grandmother; Colon cancer (age  of onset: 49) in her mother; Diabetes in her maternal grandmother; Hyperlipidemia in her maternal grandmother; Hypertension in her father; Osteoporosis in her maternal grandmother and mother.   ROS:  Please see the history of present illness.     All other systems reviewed and negative.   PHYSICAL EXAM:  VS:  BP 88/58  Pulse 79  Ht 5\' 9"  (1.753 m)  Wt 157 lb 6.4 oz (71.396 kg)  BMI 23.23 kg/m2. Recheck BP 98/54 Well nourished, well developed WF in no acute distress. Appears younger than stated age. HEENT: normal Neck: no JVD Cardiac:  normal S1, S2; RRR; no murmur. I do not appreciate any split heart sounds, even in various positions of laying, sitting up, and leaning forward Lungs:  clear to auscultation bilaterally, no wheezing, rhonchi or rales Abd: soft, nontender, no hepatomegaly Ext: no edema Skin: warm and dry Neuro:  moves all extremities spontaneously, no focal abnormalities noted  EKG:  NSR 79bpm, rightward axis, low voltage QRS, nonspecific T wave changes inferiorly as well as V3-V6  ASSESSMENT AND PLAN:  1. Fatigue - difficult to ascertain an obvious cause but further workup is indicated. Her TSH and A1c were recently normal. I think it's prudent to check a CBC and BMET as well. I also feel her vague symptoms and baseline hypotension warrant investigation of adrenal insufficiency. Will set her up for AM cortisol level and ACTH stim test. In light of abnormal EKG, DOE and fatigue, will proceed with ETT-Myoview and echocardiogram. Her chest pain is felt atypical at this time. 2. H/o reported mitral valve prolapse - The patient is concerned about the split heart sound that I cannot appreciate today. She is concerned that it sounded different the other day. Update echo as above. 3. H/o palpitations - very infrequent, likely c/w PACs or PVCs. No reported sustained arrhythmias. 4. Hypothyroidism - euthyroid by recent labs.  Dispo: F/u 1 month with APP or Dr.  Johnsie Cancel.  Signed, Melina Copa, PA-C  09/16/2014 12:04 PM   Addendum: I was informed that [employer] feels the patient does not meet criteria for a nuclear stress test d/t no family hx, seems low risk and can walk on a treadmill. I think ETT will get [place] the information we need. Will ask our team to change the order. Dayna Dunn PA-C

## 2014-09-17 ENCOUNTER — Telehealth: Payer: Self-pay

## 2014-09-17 LAB — ACTH STIMULATION, 3 TIME POINTS

## 2014-09-17 NOTE — Telephone Encounter (Signed)
lmptcb for lab results 

## 2014-09-17 NOTE — Telephone Encounter (Signed)
Judeen Hammans from [facility]. [facility] had her labs drawn, but incorrectly. Cortisol Stimulation labs require 3 lab draws over 1 hour.   Routing to lab.

## 2014-09-20 ENCOUNTER — Other Ambulatory Visit: Payer: Self-pay | Admitting: *Deleted

## 2014-09-20 DIAGNOSIS — Z87898 Personal history of other specified conditions: Secondary | ICD-10-CM

## 2014-09-20 DIAGNOSIS — R9431 Abnormal electrocardiogram [ECG] [EKG]: Secondary | ICD-10-CM

## 2014-09-20 DIAGNOSIS — R0602 Shortness of breath: Secondary | ICD-10-CM

## 2014-09-20 DIAGNOSIS — R5383 Other fatigue: Secondary | ICD-10-CM

## 2014-09-21 ENCOUNTER — Telehealth: Payer: Self-pay | Admitting: *Deleted

## 2014-09-21 DIAGNOSIS — R9431 Abnormal electrocardiogram [ECG] [EKG]: Secondary | ICD-10-CM

## 2014-09-21 DIAGNOSIS — R0609 Other forms of dyspnea: Principal | ICD-10-CM

## 2014-09-21 DIAGNOSIS — R5383 Other fatigue: Secondary | ICD-10-CM

## 2014-09-21 NOTE — Telephone Encounter (Signed)
Left message for patient to call back concerning precert of nuc stress test. The precert team Suanne Marker) states she spoke with [facility] and they will only approve a GXT which is procedure code [id number]. The patient would have to call [employer] if she wants to challenge this. Currently patient is scheduled for a nuc stress test on 10/23 which needs to be rescheduled to a GXT.

## 2014-09-22 NOTE — Progress Notes (Signed)
Reviewed personally.  M. Suzanne Aaryn Parrilla, MD.  

## 2014-09-24 ENCOUNTER — Encounter: Payer: Self-pay | Admitting: Physician Assistant

## 2014-09-24 ENCOUNTER — Encounter (HOSPITAL_COMMUNITY): Payer: BC Managed Care – PPO

## 2014-09-24 ENCOUNTER — Ambulatory Visit (HOSPITAL_COMMUNITY): Payer: BC Managed Care – PPO | Attending: Cardiology | Admitting: Cardiology

## 2014-09-24 ENCOUNTER — Other Ambulatory Visit (INDEPENDENT_AMBULATORY_CARE_PROVIDER_SITE_OTHER): Payer: BC Managed Care – PPO | Admitting: *Deleted

## 2014-09-24 DIAGNOSIS — R9431 Abnormal electrocardiogram [ECG] [EKG]: Secondary | ICD-10-CM | POA: Insufficient documentation

## 2014-09-24 DIAGNOSIS — R5383 Other fatigue: Secondary | ICD-10-CM

## 2014-09-24 DIAGNOSIS — R0609 Other forms of dyspnea: Principal | ICD-10-CM

## 2014-09-24 DIAGNOSIS — R0602 Shortness of breath: Secondary | ICD-10-CM | POA: Diagnosis present

## 2014-09-24 DIAGNOSIS — R002 Palpitations: Secondary | ICD-10-CM | POA: Diagnosis not present

## 2014-09-24 NOTE — Progress Notes (Signed)
Echo performed. 

## 2014-09-24 NOTE — Addendum Note (Signed)
Addended by: Michae Kava on: 09/24/2014 12:51 PM   Modules accepted: Orders

## 2014-09-24 NOTE — Telephone Encounter (Signed)
lmptcb to try and get pt rescheduled for a GXT due to [employer] denied myoview. I will place order in computer today.

## 2014-09-27 NOTE — Telephone Encounter (Addendum)
An update regarding behind-the-scenes conversation - Initially a nuclear stress test was ordered for further evaluation, but [employer] denied this. They would, however, authorize an ETT. I reviewed the chart further and felt this was an appropriate substitution and should get [place] the information we need.  Per a prior message from [facility], "I spoke to Vashti Hey who originally scheduled her myoview.She stated she explained to Ms Blazejewski about the reason of why she would need to change her procedures and she said she would perfer to talk to precert cause she believes that the approval through her insurance is pending. Neoma Laming went to talk to [employer] about this matter to [employer] who stated that they have talked to her already."  It looks like Julaine Hua called her on 09/24/14 asking her to schedule this but I don't see it in the system scheduled. Can someone look into this to find out an update and make sure it does get scheduled?  Thanks, Melina Copa PA-C

## 2014-09-27 NOTE — Telephone Encounter (Signed)
Spoke w/[facility]'s Vashti Hey has left pt message to call us to schedule GXT.  Advised Danya Dunn,PA that we were waiting on pt to call back.

## 2014-09-29 ENCOUNTER — Telehealth: Payer: Self-pay

## 2014-09-29 DIAGNOSIS — R6889 Other general symptoms and signs: Secondary | ICD-10-CM

## 2014-09-29 LAB — ACTH STIMULATION, 3 TIME POINTS
CORTISOL 30 MIN: 9.8 ug/dL — AB (ref >20.0)
CORTISOL BASE: 16.7 ug/dL
Cortisol, 60 Min: 9.7 ug/dL — ABNORMAL LOW (ref >20)

## 2014-09-29 LAB — CORTISOL-AM, BLOOD: CORTISOL - AM: 16.8 ug/dL (ref 4.3–22.4)

## 2014-09-29 NOTE — Telephone Encounter (Signed)
Patient informed. Internal referral done in Epic for Endocrinology.

## 2014-09-29 NOTE — Telephone Encounter (Signed)
Message copied by Brett Canales on Wed Sep 29, 2014  4:23 PM ------      Message from: Charlie Pitter      Created: Wed Sep 29, 2014  1:26 PM       ACTH stim test was abnormal. Her base cortisol level was normal, but cortisol levels at 30 minutes and 60 minutes after ACTH administration were low. This might suggest adrenal insufficiency. Please let patient know results and please place referral to endocrinology.      Dayna Dunn PA-C       ------

## 2014-09-30 ENCOUNTER — Telehealth: Payer: Self-pay | Admitting: *Deleted

## 2014-09-30 NOTE — Telephone Encounter (Signed)
I called to schedule Nancy Glenn her gxt, she refused.

## 2014-10-04 ENCOUNTER — Encounter: Payer: Self-pay | Admitting: Physician Assistant

## 2014-10-13 ENCOUNTER — Telehealth: Payer: Self-pay | Admitting: Physician Assistant

## 2014-10-13 NOTE — Telephone Encounter (Signed)
I received a message from triage that Dr. Dwyane Dee was requesting a call back to discuss the ACTH stim test results. I called him back. He believes the results don't really make much sense - that her baseline cortisol is higher before the injection then decreases. We discussed her clinical presentation of fatigue, hypotension, and vague GI complaints. He graciously agrees to see her in consultation. They will likely need to repeat the ACTH stim test to verify for any abnormalities. Note this will be her 3rd test since the first one apparently was done incorrectly. If her 3rd test is markedly different from the 2nd test, it may be worthwhile for [place] to investigate the manner in which the lab is performing the test to verify correct procedure to avoid future errors. Dr. Ronnie Derby office will be calling her to arrange the consultation. I appreciate his assistance. Bartolo Montanye PA-C

## 2014-10-18 ENCOUNTER — Ambulatory Visit: Payer: BC Managed Care – PPO | Admitting: Obstetrics & Gynecology

## 2014-10-20 ENCOUNTER — Ambulatory Visit (INDEPENDENT_AMBULATORY_CARE_PROVIDER_SITE_OTHER): Payer: BC Managed Care – PPO | Admitting: Endocrinology

## 2014-10-20 ENCOUNTER — Other Ambulatory Visit: Payer: Self-pay | Admitting: *Deleted

## 2014-10-20 ENCOUNTER — Encounter: Payer: Self-pay | Admitting: Endocrinology

## 2014-10-20 VITALS — BP 88/64 | HR 81 | Temp 97.7°F | Resp 14 | Ht 69.0 in | Wt 154.2 lb

## 2014-10-20 DIAGNOSIS — I9589 Other hypotension: Secondary | ICD-10-CM

## 2014-10-20 DIAGNOSIS — R5383 Other fatigue: Secondary | ICD-10-CM

## 2014-10-20 DIAGNOSIS — F329 Major depressive disorder, single episode, unspecified: Secondary | ICD-10-CM

## 2014-10-20 DIAGNOSIS — E063 Autoimmune thyroiditis: Secondary | ICD-10-CM

## 2014-10-20 DIAGNOSIS — F32A Depression, unspecified: Secondary | ICD-10-CM

## 2014-10-20 DIAGNOSIS — E038 Other specified hypothyroidism: Secondary | ICD-10-CM

## 2014-10-20 MED ORDER — THYROID 60 MG PO TABS: ORAL_TABLET | ORAL | Status: DC

## 2014-10-20 NOTE — Patient Instructions (Signed)
Take armour thyroid 1 daily plus 1/2 pill twice a week

## 2014-10-20 NOTE — Progress Notes (Signed)
Patient ID: Nancy Glenn, female   DOB: [birth year], 53 y.o.   MRN: [id number]   Chief complaint: Fatigue  History of Present Illness: About 5 years ago she was evaluated by her gynecologist for symptoms of marked fatigue, lack of motivation, depression and sweating.  She was told she had hypothyroidism although baseline labs are not available on record She thinks she was started with 50 g levothyroxine supplement. However she does not think she felt much better with starting thyroid supplement Over the last few years she has had gradual increase in her thyroid dose done by her gynecologist but she does not feel any improvement with changes in the doses In 1/15 because of her TSH of about 8 her dose was increased from 50 up to 75 g and recently in 9/15 the dose was increased to 88 g.   Lab Results  Component Value Date   TSH 1.181 09/09/2014   TSH 4.817* 08/05/2014   TSH 1.572 12/23/2013    The patient is complaining about significant fatigue at this time and she thinks she feels worse than 5 years ago She has no motivation, continues to feel significantly depressed and does not go out of her home unless she is going grocery shopping or to see her doctors She was being seen by her cardiologist recently for palpitations and was noticed to have a systolic blood pressure of 88.  However the patient said that she usually has a relatively low blood pressure and this is not new.  Review of her chart indicates systolic blood pressure usually below 100 She only rarely feels lightheaded or faint on standing up.  At times she may feel a little dizzy and off-balance She has not had any change in appetite which is relatively small No recent change in weight She has occasional nausea and vomiting which has been going on for 2 or 3 years   Wt Readings from Last 3 Encounters:  10/20/14 154 lb 3.2 oz (69.945 kg)  09/16/14 157 lb 6.4 oz (71.396 kg)  09/14/14 156 lb (70.761 kg)     Past Medical  History  Diagnosis Date  . Arthritis     lower back  . Anxiety   . Depression   . Hypothyroidism   . Hx of migraines   . MVP (mitral valve prolapse)     a. With remote pregnancy per patient. b. 2D Echo 09/2014: normal EF, mild MR, trace pericardial effusion.  Marland Kitchen HSV-2 infection   . Abnormal Pap smear 5/10    Glandular Cells   . HPV test positive   . Normal cardiac stress test     a. Normal ETT 10/2012.  Marland Kitchen History of palpitations     Past Surgical History  Procedure Laterality Date  . Periodontal  2006  . Breast enhancement surgery  1993  . Other surgical history  2001    Tummy Tuck  . Other surgical history  06/2007    Labial Reduction/ IUD insertion--removed within 1 month of placement    Family History  Problem Relation Age of Onset  . Colon cancer Mother 64  . Osteoporosis Mother   . Osteoporosis Maternal Grandmother   . Diabetes Maternal Grandmother   . Hyperlipidemia Maternal Grandmother   . Hypertension Father   . Breast cancer Paternal Grandmother 74    Social History:  reports that she has never smoked. She has never used smokeless tobacco. She reports that she does not drink alcohol or use illicit drugs.  Allergies:  Allergies  Allergen Reactions  . Sulfa Antibiotics Other (See Comments)    Migraines/swelling      Medication List       This list is accurate as of: 10/20/14  2:29 PM.  Always use your most recent med list.               ALPRAZolam 0.5 MG tablet  Commonly known as:  XANAX  Take 0.5 mg by mouth daily.     levothyroxine 88 MCG tablet  Commonly known as:  SYNTHROID  Take 1 tablet (88 mcg total) by mouth daily. One po qd     Oxycodone HCl 10 MG Tabs  Take 1 tablet by mouth every 6 (six) hours.     Vitamin D (Ergocalciferol) 50000 UNITS Caps capsule  Commonly known as:  DRISDOL  50,000 Units once a week.     BRINTELLIX 20 MG Tabs  Generic drug:  Vortioxetine HBr  Takes 1/2 tablet     Vortioxetine HBr 10 MG Tabs  Take 5 mg  by mouth daily.        LABS:  No visits with results within 1 Week(s) from this visit. Latest known visit with results is:  Lab on 09/24/2014  Component Date Value Ref Range Status  . Cortisol - AM 09/24/2014 16.8  4.3 - 22.4 ug/dL Final  . Cortisol, Base 09/24/2014 16.7   Final   Comment:                            AM:  4.3 - 22.4 ug/dL                          PM:  3.1 - 16.7 ug/dL  . Cortisol, 30 Min 09/24/2014 9.8* >20.0 ug/dL Final  . Cortisol, 60 Min 09/24/2014 9.7* >20 ug/dL Final     REVIEW OF SYSTEMS:           No unusual headaches.       Skin: No rash, hair loss or dry skin       She has not had any diabetes or abnormalities of glucose      She sometimes has heart fluttering and has seen a cardiologist     No abdominal pain.  Bowel habits: She has had constipation which she thinks is from her pain medications       No joint  Pains.she does have severe low back pain and is being treated by pain management, she thinks this is not relieved by current medications  She has been treated for depression by a psychiatrist but has not seen her for about a year.  Also previously had been going to counseling with a therapist  She has not had menstrual cycles for at least a year and does get periodic hot flashes    PHYSICAL EXAM:  BP 122/78 mmHg  Pulse 81  Temp(Src) 97.7 F (36.5 C)  Resp 14  Ht 5\' 9"  (1.753 m)  Wt 154 lb 3.2 oz (69.945 kg)  BMI 22.76 kg/m2  SpO2 95%  GENERAL: Averagely built and nourished.  Is not unusually anxious, flat affect  No pallor, clubbing, lymphadenopathy or edema.   Skin:  no rash, hirsutism or pigmentation.  EYES:  Externally normal.  Fundii:  normal discs and vessels.  ENT: Oral mucosa and tongue normal.  No oral pigmentation seen  THYROID:  Not palpable.  HEART:  Normal  S1 and  S2; no murmur or click.  CHEST:  Normal shape.  Lungs: Vescicular breath sounds heard equally.  No crepitations/ wheeze.  ABDOMEN:   Liver and  spleen not palpable.  No other mass or tenderness.  NEUROLOGICAL: .Reflexes are bilaterally normal at biceps  JOINTS:  Normal.   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  Does not have associated goiter.  Has had increasing her dose this year done by her gynecologist and recent TSH normal Although she has had mostly fatigue as a symptom this is nonspecific as she has no improvement in her energy level with taking thyroid supplements over the last 5 years   FATIGUE: This is likely to be from her marked depression which is inadequately treated She has no typical symptoms of adrenal insufficiency in the form of weight loss, persistent GI symptoms, orthostatic lightheadedness and no signs of cutaneous or mucosal pigmentation She does appear to have chronically low blood pressure Also her morning cortisol level is high normal. Although she had an ACTH stimulation test ordered she apparently did not have any Cortrosyn for injection that day and the test is not valid.  PLAN:  Trial of Armour Thyroid instead of levothyroxine as she may subjectively feel better with this.  Discussed that this has a combination of T4 and T3.  With her current dose of 88 g Synthroid she will take 60 mg of Armour Thyroid with an additional 60 mg per week in split doses.  She will try this for 30 days and follow-up with another TSH If subjectively she is not any better she can go back to Synthroid  Given names of psychiatrists for her to see for management of her persistent and significant depression  Currently do not think she needs evaluation for adrenal insufficiency as there are no convincing clinical features for this condition and she had a normal cortisol level.  Urinary cortisol levels will be altered by her untreated depression  MENOPAUSE: She does appear to be symptomatic from her menopause and significant beading to her  depression. She will discuss management of this with at least short-term  HRT with her gynecologist this week  [facility] 10/20/2014, 2:29 PM

## 2014-10-22 ENCOUNTER — Ambulatory Visit (INDEPENDENT_AMBULATORY_CARE_PROVIDER_SITE_OTHER): Payer: BC Managed Care – PPO | Admitting: Obstetrics & Gynecology

## 2014-10-22 ENCOUNTER — Ambulatory Visit (HOSPITAL_COMMUNITY)
Admission: RE | Admit: 2014-10-22 | Discharge: 2014-10-22 | Disposition: A | Discharge status: Home / Self Care | Payer: BC Managed Care – PPO | Attending: Psychiatry | Admitting: Psychiatry

## 2014-10-22 ENCOUNTER — Telehealth: Payer: Self-pay | Admitting: Obstetrics & Gynecology

## 2014-10-22 ENCOUNTER — Encounter: Payer: Self-pay | Admitting: Obstetrics & Gynecology

## 2014-10-22 VITALS — BP 96/70 | HR 88 | Resp 16 | Ht 68.75 in | Wt 155.0 lb

## 2014-10-22 DIAGNOSIS — Z01419 Encounter for gynecological examination (general) (routine) without abnormal findings: Secondary | ICD-10-CM

## 2014-10-22 DIAGNOSIS — E039 Hypothyroidism, unspecified: Secondary | ICD-10-CM | POA: Diagnosis not present

## 2014-10-22 DIAGNOSIS — F329 Major depressive disorder, single episode, unspecified: Secondary | ICD-10-CM | POA: Diagnosis present

## 2014-10-22 DIAGNOSIS — M1990 Unspecified osteoarthritis, unspecified site: Secondary | ICD-10-CM | POA: Diagnosis not present

## 2014-10-22 DIAGNOSIS — F419 Anxiety disorder, unspecified: Secondary | ICD-10-CM | POA: Diagnosis not present

## 2014-10-22 DIAGNOSIS — Z124 Encounter for screening for malignant neoplasm of cervix: Secondary | ICD-10-CM

## 2014-10-22 DIAGNOSIS — F331 Major depressive disorder, recurrent, moderate: Secondary | ICD-10-CM | POA: Insufficient documentation

## 2014-10-22 NOTE — Telephone Encounter (Signed)
Call picked up from [facility]. Patient states she is not sure she is in the right place. "There are drug addicts and police and they wont let you have any personal belongings." Advised that this is the correct place but that people all have different behavioral health needs and that those steps are in place as a precaution for everyone's safety. Dr Sabra Heck does think it is essential that she stay and be seen/evaluated. She is asking is she it to see a certain provider/ advised that although she will see a counselor first and that Dr Sabra Heck has called over, she will see provider that becomes available first.  If she does not want to be seen there can go to [facility] ED but stressed recommendation she be seen at one of these two locations. Patient states she "will try" and disconnects call. Karen Chafe RN present for call.  Routing to provider for final review. Patient agreeable to disposition. Will close encounter  CC Dr Quincy Simmonds since Dr Sabra Heck out of office.

## 2014-10-22 NOTE — Telephone Encounter (Signed)
Patient calling from [facility] stating Dr. Sabra Heck told her to go over there after her appointment. Patient calling stating she is "scared" as there are "police cars around." Please advise?
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
## 2014-10-22 NOTE — Progress Notes (Signed)
53 y.o. [id number] DivorcedCaucasianF here for annual exam.  Reports she is struggling with getting out of her house.  Very tearful today.  Reports she washes her hair once a week.  She doesn't go get her nails done.  Pt states she feels "suicidal".  States she doesn't have a plan.  Feels at the end of her rope.    Having some hot flashes but very tolerable.    Patient's last menstrual period was 12/03/2013.          Sexually active: No.  The current method of family planning is post menopausal status.    Exercising: No.  The patient does not participate in regular exercise at present. Smoker:  no  Health Maintenance: Pap: 09/2013 Neg History of abnormal Pap:  yes MMG:  US/MMG left Diagnostic 08/19/14 BIRADS2:Benign. MMG screening 09/2013 BIRADS1:Neg Colonoscopy:  2013. Family Hx repeat 3 years.  BMD:   08/2012 TDaP:  2014 Screening Labs: 09/16/14, Hb: 14.1, Urine: Protein=Trace 09/09/14   reports that she has never smoked. She has never used smokeless tobacco. She reports that she does not drink alcohol or use illicit drugs.  Past Medical History  Diagnosis Date  . Arthritis     lower back  . Anxiety   . Depression   . Hypothyroidism   . Hx of migraines   . MVP (mitral valve prolapse)     a. With remote pregnancy per patient. b. 2D Echo 09/2014: normal EF, mild MR, trace pericardial effusion.  Marland Kitchen HSV-2 infection   . Abnormal Pap smear 5/10    Glandular Cells   . HPV test positive   . Normal cardiac stress test     a. Normal ETT 10/2012.  Marland Kitchen History of palpitations     Past Surgical History  Procedure Laterality Date  . Periodontal  2006  . Breast enhancement surgery  1993  . Other surgical history  2001    Tummy Tuck  . Other surgical history  06/2007    Labial Reduction/ IUD insertion--removed within 1 month of placement    Current Outpatient Prescriptions  Medication Sig Dispense Refill  . ALPRAZolam (XANAX) 0.5 MG tablet Take 0.5 mg by mouth daily.     . Oxycodone HCl  10 MG TABS Take 1 tablet by mouth every 6 (six) hours.    . Vitamin D, Ergocalciferol, (DRISDOL) 50000 UNITS CAPS capsule 50,000 Units once a week.    . levothyroxine (SYNTHROID, LEVOTHROID) 88 MCG tablet Take 1 tablet by mouth daily.  1  . thyroid (ARMOUR THYROID) 60 MG tablet Take 1 tablet daily with an extra half tablet 2 times a week 35 tablet 3  . [DISCONTINUED] CALCIUM CARBONATE PO Take 2 tablets by mouth daily.     No current facility-administered medications for this visit.    Family History  Problem Relation Age of Onset  . Colon cancer Mother 68  . Osteoporosis Mother   . Thyroid disease Mother   . Osteoporosis Maternal Grandmother   . Diabetes Maternal Grandmother   . Hyperlipidemia Maternal Grandmother   . Hypertension Father   . Breast cancer Paternal Grandmother 25    ROS:  Pertinent items are noted in HPI.  Otherwise, a comprehensive ROS was negative.  Exam:   BP 96/70 mmHg  Pulse 88  Resp 16  Ht 5' 8.75" (1.746 m)  Wt 155 lb (70.308 kg)  BMI 23.06 kg/m2  LMP 12/03/2013  Weight change: +5#  Height: 5' 8.75" (174.6 cm)  Ht Readings from  Last 3 Encounters:  10/22/14 5' 8.75" (1.746 m)  10/20/14 5\' 9"  (1.753 m)  09/16/14 5\' 9"  (1.753 m)    General appearance: alert, cooperative and appears stated age Head: Normocephalic, without obvious abnormality, atraumatic Neck: no adenopathy, supple, symmetrical, trachea midline and thyroid normal to inspection and palpation Lungs: clear to auscultation bilaterally Breasts: normal appearance, no masses or tenderness Heart: regular rate and rhythm Abdomen: soft, non-tender; bowel sounds normal; no masses,  no organomegaly Extremities: extremities normal, atraumatic, no cyanosis or edema Skin: Skin color, texture, turgor normal. No rashes or lesions Lymph nodes: Cervical, supraclavicular, and axillary nodes normal. No abnormal inguinal nodes palpated Neurologic: Grossly normal   Pelvic: External genitalia:  no  lesions              Urethra:  normal appearing urethra with no masses, tenderness or lesions              Bartholins and Skenes: normal                 Vagina: normal appearing vagina with normal color and discharge, no lesions              Cervix: no lesions              Pap taken: Yes.   Bimanual Exam:  Uterus:  normal size, contour, position, consistency, mobility, non-tender              Adnexa: normal adnexa and no mass, fullness, tenderness               Rectovaginal: Confirms               Anus:  normal sphincter tone, no lesions  A:  Well Woman with normal exam Depression.  Acutely worse.  Pt does have suicidal ideation.  No active plan.  Pt is going to go straight to [facility] now for assessment.   Osteoporosis in mother Chronic back pain on chronic pain medication.  Management by Dr. Vira Blanco.  P: Mammogram scheduled today. pap smear today. H/O neg HR HPV/neg Pap 2013. Pt and I discussed guidelines. Requests yearly Pap smear CMP, Lipids, TSH and panel, Vit D, cbc Will need RX for Synthroid pending results of labs. Tdap today. return annually or prn  An After Visit Summary was printed and given to the patient.

## 2014-10-22 NOTE — Telephone Encounter (Signed)
Spoke with patient at time of incoming call.  She is tearful and wondering if she is in the right place and expresses hesitancy to go in the building because there are police there and wondering if they will keep her. Advised patient she is in the correct place and patient placed on hold to speak with nursing supervisor.  Witnessed phone call with transfer of call.

## 2014-10-25 ENCOUNTER — Telehealth: Payer: Self-pay | Admitting: Obstetrics & Gynecology

## 2014-10-25 NOTE — Telephone Encounter (Signed)
Pt says she is calling Dr Ammie Ferrier nurse back from Friday but there is no telephone call in system.

## 2014-10-25 NOTE — Telephone Encounter (Signed)
Return call to patient. Was sen at [facility] center and was discharged. Has follow up appointment on 11-04-14. Was not able to get appointment with MD Dr Sabra Heck recommended, Dr Dwyane Dee. They said Dr Dwyane Dee only sees patietn 89 and under. She cant remember name of MD she is scheduled to see. Repeated three times that she didn't get to see the MD DR Sabra Heck wanted her to see that does the specific treatment she needs. She doesn't understand why since Dr Sabra Heck knows someone that see Dr Dwyane Dee that is over 54. Advised that there may be extenuating circumstances with that case that we are not aware of. Dr Sabra Heck will review this call and it would be helpful if patient can call us with name of provider she is scheduled to see. Important thing is that she has been see, evaluated and scheduled for follow-up in a reasonable time period without waiting months for an appointment. Patient will call back and leave message with name of provider she is scheduled to see.

## 2014-10-25 NOTE — Telephone Encounter (Signed)
Patient calling to report she will be seeing Dr. Doyne Keel at [facility].

## 2014-10-27 NOTE — Addendum Note (Signed)
Addended by: Megan Salon on: 10/27/2014 06:48 AM   Modules accepted: Orders

## 2014-11-01 LAB — IPS PAP TEST WITH REFLEX TO HPV

## 2014-11-04 ENCOUNTER — Ambulatory Visit (INDEPENDENT_AMBULATORY_CARE_PROVIDER_SITE_OTHER): Payer: BC Managed Care – PPO | Admitting: Psychiatry

## 2014-11-04 ENCOUNTER — Telehealth (HOSPITAL_COMMUNITY): Payer: Self-pay

## 2014-11-04 ENCOUNTER — Encounter (HOSPITAL_COMMUNITY): Payer: Self-pay | Admitting: Psychiatry

## 2014-11-04 VITALS — BP 128/62 | HR 88 | Ht 69.0 in | Wt 155.8 lb

## 2014-11-04 DIAGNOSIS — F411 Generalized anxiety disorder: Secondary | ICD-10-CM

## 2014-11-04 DIAGNOSIS — F331 Major depressive disorder, recurrent, moderate: Secondary | ICD-10-CM

## 2014-11-04 DIAGNOSIS — F418 Other specified anxiety disorders: Secondary | ICD-10-CM | POA: Insufficient documentation

## 2014-11-04 MED ORDER — LITHIUM CARBONATE ER 300 MG PO TBCR: 300.0000 mg | EXTENDED_RELEASE_TABLET | Freq: Every day | ORAL | Status: DC

## 2014-11-04 MED ORDER — ALPRAZOLAM 0.5 MG PO TABS: 0.5000 mg | ORAL_TABLET | Freq: Every day | ORAL | Status: DC
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
## 2014-11-09 ENCOUNTER — Other Ambulatory Visit (HOSPITAL_COMMUNITY): Payer: Self-pay | Admitting: *Deleted

## 2014-11-09 DIAGNOSIS — F411 Generalized anxiety disorder: Secondary | ICD-10-CM

## 2014-11-11 ENCOUNTER — Other Ambulatory Visit (HOSPITAL_COMMUNITY): Payer: Self-pay | Admitting: Psychiatry

## 2014-11-11 ENCOUNTER — Telehealth (HOSPITAL_COMMUNITY): Payer: Self-pay | Admitting: *Deleted

## 2014-11-11 MED ORDER — ALPRAZOLAM 1 MG PO TABS: 0.5000 mg | ORAL_TABLET | Freq: Three times a day (TID) | ORAL | Status: AC | PRN

## 2014-11-11 NOTE — Telephone Encounter (Signed)
Pt left voice message wanting information on her plan's coverage for [facility] services. Pt also wanting information on a referral for psychotherapy, as suggested by her psychiatrist.

## 2014-11-11 NOTE — Telephone Encounter (Signed)
Contacted pt to inform her that no information is currently available re: her insurance plan's coverage for TMS therapy, but benefits investigation is underway. Instructed pt to call front desk at [facility] at [phone] to request appointment with Francee Piccolo, LCSW. Discussed this plan with pt's psychiatrist beforehand, who approved. Pt said she would follow through with this plan. Will contact pt again when benefits investigation has concluded re: coverage for [facility] services.

## 2014-11-12 ENCOUNTER — Telehealth (HOSPITAL_COMMUNITY): Payer: Self-pay | Admitting: *Deleted

## 2014-11-12 ENCOUNTER — Other Ambulatory Visit: Payer: BC Managed Care – PPO

## 2014-11-12 NOTE — Telephone Encounter (Signed)
Called pt to inform her of this office's receipt of her benefits investigation for [facility] therapy. Per benefits investigation conducted by [employer] (release signed by patient on file for this service), the pt's insurance would cover 100% of the allowed amount for TMS tx, with the pt being responsible for a $50 copay. Explained to pt that a full course of [place] tx is usually 36 treatments, which would come to a $1800 contribution on her part. Pt stated she wants to take some time to consider this before making a decision.

## 2014-11-15 ENCOUNTER — Other Ambulatory Visit: Payer: Self-pay | Admitting: Pain Medicine

## 2014-11-15 DIAGNOSIS — M5136 Other intervertebral disc degeneration, lumbar region: Secondary | ICD-10-CM

## 2014-11-17 ENCOUNTER — Ambulatory Visit: Payer: BC Managed Care – PPO | Admitting: Endocrinology

## 2014-11-20 ENCOUNTER — Other Ambulatory Visit: Payer: Self-pay

## 2014-11-21 ENCOUNTER — Ambulatory Visit
Admission: RE | Admit: 2014-11-21 | Discharge: 2014-11-21 | Disposition: A | Discharge status: Home / Self Care | Payer: BC Managed Care – PPO | Source: Ambulatory Visit | Attending: Pain Medicine | Admitting: Pain Medicine

## 2014-11-21 DIAGNOSIS — M5136 Other intervertebral disc degeneration, lumbar region: Secondary | ICD-10-CM

## 2014-11-25 ENCOUNTER — Encounter: Payer: Self-pay | Admitting: *Deleted

## 2014-11-29 ENCOUNTER — Other Ambulatory Visit: Payer: Self-pay | Admitting: Obstetrics & Gynecology

## 2014-11-29 NOTE — Telephone Encounter (Signed)
Medication refill request: Synthroid 88 mcg  Last AEX:  10/22/14 with Dr. Sabra Heck Next AEX: No AEX scheduled for 2016 Last TSH level checked: 09/09/14 at 1.181 Last MMG (if hormonal medication request): N/A Refill authorized: #30/11 rfs?, please advise.

## 2014-12-07 ENCOUNTER — Other Ambulatory Visit: Payer: Self-pay | Admitting: Obstetrics & Gynecology

## 2014-12-07 ENCOUNTER — Telehealth: Payer: Self-pay | Admitting: Physician Assistant

## 2014-12-07 NOTE — Telephone Encounter (Signed)
New Message  Pt received email for appt reminder on 1/7 with Richardson Dopp and was not aware of the appt nor why it was made. Pt requested to speak with Rn about the appt. Please call back and discuss.

## 2014-12-08 ENCOUNTER — Telehealth: Payer: Self-pay | Admitting: Physician Assistant

## 2014-12-08 NOTE — Telephone Encounter (Signed)
Pt called to schedule her myoview and operator sent call to me. I scheduled pt for ETT Myoview 12/16/14 @ 12 pm. I verbally went over instructions however; I will also mail them to pt today. Pt verbalized understanding to Plan of Care.

## 2014-12-08 NOTE — Telephone Encounter (Signed)
New msg         Contacted pt to confirm appt and she states she wasn't aware of any appt.  Pt stated she doesn't need any procedures completed and has nothing to discuss but doesn't want to cancel visit until speaking with nurse.   Please call pt.

## 2014-12-08 NOTE — Telephone Encounter (Signed)
I rtnd pt's call and explained that she was supposed to come back in 1 month from 09/2014 to have GXT due to insurance denied myoview. Pt now states insurance should cover myoview. I stated we will cancel 1/7 w/Scott W. PA and have [place] call and shedule myoview. Pt also asked for her results from the echo back 09/2014, she said no one went over the results. I pulled up echo results and stated to pt that Kelli Churn, RN s/w her on 09/24/14 @ 4:28 pm about results. I did go over results again for pt on her echo. Pt verbalized understanding to Plan of care.

## 2014-12-08 NOTE — Telephone Encounter (Signed)
Thank you for the update. I do see the following result note on her echo from back in 09/2014 - it appears Kelli Churn did speak with the patient about her echo, at least per the result note:  "Notes Recorded by Ellwood Dense, RN on 09/24/2014 at 4:28 PM Spoke with pt about results - she states understanding. ------  Notes Recorded by Charlie Pitter, PA-C on 09/24/2014 at 2:12 PM 2D echo showed normal LV function, mild mitral regurgitation, and trivial pericardial effusion. No mitral valve prolapse demonstrated - structurally normal valve. These findings are not the cause of her symptoms. We will await the results of the remainder of her workup. Rees Santistevan PA-C"  Will await nuc result. Panda Crossin PA-C

## 2014-12-08 NOTE — Telephone Encounter (Signed)
Medication refill request: Zovirax 5% ointment  Last AEX:  10/22/14 with Dr. Sabra Heck Next AEX: No AEX scheduled for 2016 Last MMG (if hormonal medication request): N/A Refill authorized: *#30/3 rfs, please advise.  (Routed to Dr. Quincy Simmonds since Dr. Sabra Heck is out of office today.)

## 2014-12-09 ENCOUNTER — Ambulatory Visit: Payer: Self-pay | Admitting: Physician Assistant

## 2014-12-16 ENCOUNTER — Ambulatory Visit (HOSPITAL_COMMUNITY): Payer: BLUE CROSS/BLUE SHIELD | Attending: Cardiovascular Disease | Admitting: Radiology

## 2014-12-16 VITALS — BP 94/76 | Ht 69.0 in | Wt 151.0 lb

## 2014-12-16 DIAGNOSIS — R079 Chest pain, unspecified: Secondary | ICD-10-CM | POA: Insufficient documentation

## 2014-12-16 DIAGNOSIS — R9431 Abnormal electrocardiogram [ECG] [EKG]: Secondary | ICD-10-CM | POA: Insufficient documentation

## 2014-12-16 DIAGNOSIS — R0602 Shortness of breath: Secondary | ICD-10-CM

## 2014-12-16 DIAGNOSIS — R5382 Chronic fatigue, unspecified: Secondary | ICD-10-CM

## 2014-12-16 DIAGNOSIS — R5383 Other fatigue: Secondary | ICD-10-CM

## 2014-12-16 MED ORDER — TECHNETIUM TC 99M SESTAMIBI GENERIC - CARDIOLITE
30.0000 | Freq: Once | INTRAVENOUS | Status: AC | PRN
  Administered 2014-12-16: 30 via INTRAVENOUS

## 2014-12-16 MED ORDER — TECHNETIUM TC 99M SESTAMIBI GENERIC - CARDIOLITE
10.0000 | Freq: Once | INTRAVENOUS | Status: AC | PRN
  Administered 2014-12-16: 10 via INTRAVENOUS

## 2014-12-16 NOTE — Progress Notes (Signed)
[facility] [address] [place], [place] [address] [phone]    Cardiology Nuclear Med Study  Nancy Glenn is a 54 y.o. female     MRN : [id number]     DOB: 1960/12/18  Procedure Date: 12/16/2014  Nuclear Med Background Indication for Stress Test:  Evaluation for Ischemia and Abnormal EKG History:  2013 GXT NL Cardiac Risk Factors: none  Symptoms:  Chest Pain   Nuclear Pre-Procedure Caffeine/Decaff Intake:  10:00pm NPO After: 7:30am   Lungs:  clear O2 Sat: 98% on room air. IV 0.9% NS with Angio Cath:  22g  IV Site: R Hand  IV Started by:  Matilde Haymaker, RN  Chest Size (in):  36 Cup Size: DD  Height: 5\' 9"  (1.753 m)  Weight:  151 lb (68.493 kg)  BMI:  Body mass index is 22.29 kg/(m^2). Tech Comments:  n/a    Nuclear Med Study 1 or 2 day study: 1 day  Stress Test Type:  Stress  Reading MD: n/a  Order Authorizing Provider:  Verlin Dike  Resting Radionuclide: Technetium 75m Sestamibi  Resting Radionuclide Dose: 11.0 mCi   Stress Radionuclide:  Technetium 72m Sestamibi  Stress Radionuclide Dose: 33.0 mCi           Stress Protocol Rest HR: 70 Stress HR: 160  Rest BP: 94/76 Stress BP: 170/83  Exercise Time (min): 9:00 METS: 10.1           Dose of Adenosine (mg):  n/a Dose of Lexiscan: n/a mg  Dose of Atropine (mg): n/a Dose of Dobutamine: n/a mcg/kg/min (at max HR)  Stress Test Technologist: Glade Lloyd, BS-ES  Nuclear Technologist:  Earl Many, CNMT     Rest Procedure:  Myocardial perfusion imaging was performed at rest 45 minutes following the intravenous administration of Technetium 82m Sestamibi. Rest ECG: NSR - Normal EKG  Stress Procedure:  The patient exercised on the treadmill utilizing the Bruce Protocol for 9:00 minutes. The patient stopped due to fatigue and denied any chest pain.  Technetium 72m Sestamibi was injected at peak exercise and myocardial perfusion imaging was performed after a brief  delay. Stress ECG: No significant change from baseline ECG  QPS Raw Data Images:  Normal; no motion artifact; normal heart/lung ratio. Stress Images:  Normal homogeneous uptake in all areas of the myocardium. Rest Images:  Normal homogeneous uptake in all areas of the myocardium. Subtraction (SDS):  No evidence of ischemia. Transient Ischemic Dilatation (Normal <1.22):  0.86 Lung/Heart Ratio (Normal <0.45):  0.37  Quantitative Gated Spect Images QGS EDV:  55 ml QGS ESV:  13 ml  Impression Exercise Capacity:  Good exercise capacity. BP Response:  Normal blood pressure response. Clinical Symptoms:  No significant symptoms noted. ECG Impression:  No significant ST segment change suggestive of ischemia. Comparison with Prior Nuclear Study: No previous nuclear study performed  Overall Impression:  Normal stress nuclear study.  LV Ejection Fraction: 76.  LV Wall Motion:  NL LV Function; NL Wall Motion   [name] 12/16/2014

## 2014-12-27 ENCOUNTER — Telehealth: Payer: Self-pay | Admitting: Obstetrics & Gynecology

## 2014-12-27 NOTE — Telephone Encounter (Signed)
Spoke with patient. Patient states that she was douching in the shower this morning and felt like something "fell out my my vagina. I felt in there and it feels like a flat lump with circles on it that is 2-3 inches wide. I do not know if it is a growth or what." Patient denies any discomfort to area. Advised will need to be seen for evaluation with Dr.Miller. Patient is agreeable. Appointment scheduled for tomorrow at 10:45am with Dr.Miller. Agreeable to date and time.  Routing to provider for final review. Patient agreeable to disposition. Will close encounter

## 2014-12-27 NOTE — Telephone Encounter (Signed)
Pt found a growth in her vagina.

## 2014-12-28 ENCOUNTER — Ambulatory Visit: Payer: Medicaid Other | Admitting: Obstetrics & Gynecology

## 2014-12-28 ENCOUNTER — Telehealth: Payer: Self-pay | Admitting: Obstetrics & Gynecology

## 2014-12-28 NOTE — Telephone Encounter (Signed)
Patient canceled her appointment for vaginal lump due to the weather. She cannot get out of her driveway. Please call patient to reschedule appointment.

## 2014-12-28 NOTE — Telephone Encounter (Signed)
Left message to call Kaitlyn at [phone]. 

## 2014-12-30 ENCOUNTER — Other Ambulatory Visit: Payer: Self-pay | Admitting: Obstetrics & Gynecology

## 2014-12-31 NOTE — Telephone Encounter (Signed)
Medication refill request: Valtrex 1000 mg Last AEX:  10/22/14 Next AEX: Not scheduled  Last MMG (if hormonal medication request): [place] Left axilla BIRADS2:benign  Refill authorized: 09/21/13 #30/12R. Today #30/2R?

## 2014-12-31 NOTE — Telephone Encounter (Signed)
Left message to call Emmet Messer at [phone]. 

## 2015-01-04 ENCOUNTER — Encounter: Payer: Self-pay | Admitting: Internal Medicine

## 2015-01-04 ENCOUNTER — Other Ambulatory Visit: Payer: Self-pay | Admitting: *Deleted

## 2015-01-04 NOTE — Telephone Encounter (Signed)
Medication refill request: Valtrex 1 gram Last AEX:  10/22/14 with Dr. Sabra Heck Next AEX: No AEX scheduled for 2016 Last MMG (if hormonal medication request): N/A Refill authorized: #30/9 rfs, please advise
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
## 2015-01-04 NOTE — Telephone Encounter (Signed)
Left message to call Donn Zanetti at [phone]. 

## 2015-01-04 NOTE — Telephone Encounter (Signed)
This has already been done so was refused.

## 2015-01-11 ENCOUNTER — Ambulatory Visit: Payer: Medicaid Other | Admitting: Certified Nurse Midwife

## 2015-01-31 ENCOUNTER — Telehealth (HOSPITAL_COMMUNITY): Payer: Self-pay | Admitting: *Deleted

## 2015-01-31 NOTE — Telephone Encounter (Signed)
Dr. Doyne Keel,   Patient called requesting a letter for disability, left message on nurse's voice mail. Patient was last seen 11-04-2014.  I called patient back. Patient stated that she needs her disability claim filled out for her "mental issues" and for having to take Lithium. I advised patient that Dr. Doyne Keel has not seen her since 11-2014. I advised patient that she was suppose to have a follow up visit in 2 months and she never scheduled it. Patient scheduled a follow up for 02-17-2015 for 30 min. I advised patient that she can bring the information she has for the disability claim.  I advised patient that when she comes in for her follow up visit this does not guarantee that Dr. Doyne Keel will fill out disability claim form, Dr. Doyne Keel has to make the final decision once she speaks to her.   Patient verbalized understanding.   Patient still has Lithium, per patient did not start medication right away, wanted to wait a couple of days after visit.  Patient stated she could not do TMS due to cost.

## 2015-02-17 ENCOUNTER — Telehealth (HOSPITAL_COMMUNITY): Payer: Self-pay

## 2015-02-17 ENCOUNTER — Ambulatory Visit (HOSPITAL_COMMUNITY): Payer: Self-pay | Admitting: Psychiatry

## 2015-03-03 ENCOUNTER — Ambulatory Visit (HOSPITAL_COMMUNITY): Payer: Self-pay | Admitting: Psychiatry

## 2015-04-18 ENCOUNTER — Telehealth: Payer: Self-pay | Admitting: Obstetrics & Gynecology

## 2015-04-18 DIAGNOSIS — E038 Other specified hypothyroidism: Secondary | ICD-10-CM

## 2015-04-18 NOTE — Telephone Encounter (Signed)
Spoke with patient. Patient would like to schedule aex with Dr.Miller at this time. Last aex was on 10/22/2014. Aex scheduled for 11/21/206 at 2:45pm with Dr.Miller. Patient is agreeable to date and time. "I think I need to have my thyroid rechecked. She usually likes to do it a couple of times a year." Patient currently taking Armour thyroid 60mg  tablets once per day with an extra 1/2 tablet two times a week. Last TSH check was on 09/09/2014. Denies having any current concerns or symptoms. Advised will speak with Dr.Miller regarding TSH check and return call. Patient is agreeable.  Dr.Miller, okay for patient to wait until aex for TSH check or need to have it done sooner?

## 2015-04-18 NOTE — Telephone Encounter (Signed)
Patient is requesting lab work done (TSH).

## 2015-04-19 NOTE — Telephone Encounter (Signed)
Spoke with patient. Advised of message as seen below from [place]. Patient states "I have only seen that doctor once. He is not my doctor. Dr.Miller has been checking my level for years. I do not want to switch doctors if I do not have to." Advised patient as we do not specialize in endocrinology it is most appropriate for patient to be followed with endocrinologist for her thyroid levels and mediation. "When she referred me the doctor said he had no clue why I was there since she already put me on medicine." Advised this is to establish care and continue appropriate follow up with a specialist. Patient still declines. "I just don't understand why she can't keep checking it." Advised patient will speak with Dr.Miller and return call with further recommendations. Patient is agreeable.

## 2015-04-19 NOTE — Telephone Encounter (Signed)
Dr. Dwyane Dee (endocrinology) wrote the Armour thyroid for her.  She needs to let him continue to follow this from now on.

## 2015-04-19 NOTE — Telephone Encounter (Signed)
I do not prescribe armour thyroid as this is a specialized thyroid medication.  I do not adjust this medication either.  So, if her thyroid levels are not normal, I will have her follow up with him.  Also, she will need to continue seeing him yearly for this prescription.  Since he felt her medication needed to be changed to this, he needs to continue her follow up.  I am sorry if this makes her upset.

## 2015-04-20 NOTE — Telephone Encounter (Signed)
Left message to call Kaitlyn at [phone]. 

## 2015-04-20 NOTE — Telephone Encounter (Signed)
Spoke with patient. Patient states "I never started on the Armour Thyroid. He recommended it and gave me a prescription but I did start taking it because he said I would have to come in every six weeks. He told me it was okay for me to stay on the levothyroxine if I wanted. Dr.Miller is the one who gave me this." Patient states that she is currently taking Levothyoxine 27mcg daily. "I really do not want to be on any other medication if I don't have to. If my levels are good I want to stay on this medication." Last TSH was performed on 09/09/2014. Advised will need to speak with Dr.Miller regarding current medication and give her a call back. Patient is agreeable.

## 2015-04-22 NOTE — Telephone Encounter (Signed)
OK to have pt repeat TSH here.  Ok to place order.  Please clarify medication list with pt.

## 2015-04-22 NOTE — Telephone Encounter (Signed)
Left message to call Leolia Vinzant at [phone]. 

## 2015-04-25 ENCOUNTER — Telehealth: Payer: Self-pay | Admitting: Obstetrics & Gynecology

## 2015-04-25 NOTE — Telephone Encounter (Signed)
Patient called and left a message during lunch on the machine cancelling her lab for TSH on 04/26/15. I called and left a message to call back to reschedule.

## 2015-04-25 NOTE — Telephone Encounter (Signed)
Spoke with patient. Advised of message as seen below form Dr.Miller. Patient is agreeable. Appointment scheduled for tomorrow 5/24 at 1:30pm. Med list reviewed and updated with patient.   Routing to provider for final review. Patient agreeable to disposition. Will close encounter.   Patient aware provider will review message and nurse will return call if any additional advice or change of disposition.

## 2015-04-26 ENCOUNTER — Other Ambulatory Visit: Payer: Medicaid Other

## 2015-04-26 NOTE — Telephone Encounter (Signed)
Thank you.  You don't need to try and call her again.  Encounter closed.

## 2015-04-26 NOTE — Telephone Encounter (Signed)
Dr Sabra Heck, just FYI//kn

## 2015-05-03 ENCOUNTER — Ambulatory Visit (INDEPENDENT_AMBULATORY_CARE_PROVIDER_SITE_OTHER): Payer: Medicaid Other | Admitting: Nurse Practitioner

## 2015-05-03 ENCOUNTER — Other Ambulatory Visit (INDEPENDENT_AMBULATORY_CARE_PROVIDER_SITE_OTHER): Payer: Medicaid Other

## 2015-05-03 ENCOUNTER — Telehealth: Payer: Self-pay | Admitting: Obstetrics & Gynecology

## 2015-05-03 ENCOUNTER — Telehealth: Payer: Self-pay

## 2015-05-03 ENCOUNTER — Encounter: Payer: Self-pay | Admitting: Nurse Practitioner

## 2015-05-03 VITALS — BP 98/64 | HR 72 | Ht 69.0 in | Wt 156.0 lb

## 2015-05-03 DIAGNOSIS — R140 Abdominal distension (gaseous): Secondary | ICD-10-CM | POA: Diagnosis not present

## 2015-05-03 DIAGNOSIS — E038 Other specified hypothyroidism: Secondary | ICD-10-CM

## 2015-05-03 LAB — TSH: TSH: 0.173 u[IU]/mL — AB (ref 0.350–4.500)

## 2015-05-03 NOTE — Telephone Encounter (Signed)
Spoke with pt.  Pt reports she is "not having gas like she used to", feels like her abdomen is distended, is worried she has a worsening hernia, and has nausea causing her emesis one to two times a week.  This has been going on for "months".  Pt reports she told her daughter, who is in [employer] school about this and she spoke with her preceptor who advised a CT scan.    Pt was seen by NP in office today who felt she has constipation.  X ray of abdomen recommended.  Pt still wants CT scan.  Advised pt if x ray is inconclusive or shows findings that additional testing warrants, will absolutely proceed with CT scan.  However, if significant stool is present on xray, then this will be treated and physical exam repeated before proceeding with CT scan.  X ray exposure with x ray vs CT also discussed.   Of note, pt has a family hx of colon cancer and had colonoscopy in 2013 with inadequate prep.  Repeat was recommended in three years.  Pt has not scheduled this to date.  Discussed this with pt as well.  She doesn't really want to have a CT done but will go if I advise it.  Pt would like to see a female if possible--Dr. Collene Mares or Dr. Olevia Perches.    So, please schedule x ray for pt and refer to Dr. Collene Mares or Dr. Olevia Perches for screening colonoscopy due to family hx of colon cancer and inadequate prep in 2013 with Dr. Hilarie Fredrickson.  Please put me at ordering physician so that I will get the test results.  Thanks.

## 2015-05-03 NOTE — Telephone Encounter (Signed)
Spoke with patient. Patient states that she has a place close to her navel that Dr.Miller has evaluated before and was told it was a hernia. Patient states the area is now the size of a grapefruit and causing her bloating, decreased bowel movements, and inability to pass gas. Patient's daughter is a PA and advised patient be seen with GI provider as she feels it may be due to a problem within her intestines. Patient has been seen with [facility] for routine colonoscopy three years ago. Patient prefers to see someone who is female and who Dr.Miller recommends. Patient asking to be seen as soon as possible as she is concerned. Advised I will speak with Dr.Miller regarding her recommendations and return call. Patient is agreeable.  Dr.Miller, is there a female provider at [facility] your recommend for patient? Dr.Brodie?

## 2015-05-03 NOTE — Telephone Encounter (Signed)
Patient was on my voicemail and had questions about why she needed to repeat her colonoscopy in 3 years.  I returned her call and explained that she had only a fair prep at her last procedure and that he has a strong family history of colon cancer and that Dr. Hilarie Fredrickson recommended a repeat procedure at 3 years.  She wants to think about it and will call back

## 2015-05-03 NOTE — Patient Instructions (Addendum)
  We will call with a plan of care.

## 2015-05-03 NOTE — Telephone Encounter (Signed)
Needs OV first.  Will need surgical consultation and not GI appt for a hernia.

## 2015-05-03 NOTE — Telephone Encounter (Signed)
Patient came into office today. Advised will need to be seen for further evaluation in office before further recommendations can be made. Appointment scheduled for today at 3:30pm with Milford Cage, FNP okay per Dr.MIller. Patient is agreeable.  Routing to provider for final review. Patient agreeable to disposition. Will close encounter.   Cc: Milford Cage, FNP  Patient aware provider will review message and nurse will return call if any additional advice or change of disposition.

## 2015-05-03 NOTE — Progress Notes (Signed)
Reviewed personally.  M. Suzanne Carrie Schoonmaker, MD.  

## 2015-05-03 NOTE — Progress Notes (Signed)
Subjective:     Patient ID: Nancy Glenn, female   DOB: 09-Sep-1961, 54 y.o.   MRN: [id number]  HPI  This 54 yo WD Fe G3P3 complains of "incisional hernia".  She states she was visiting her daughter who felt she had an abdominal incisional hernia.  She was told to contact us right away as she might be at risk for a torsion of the bowel.  The daughter checked with her preceptors at work today and felt like she needed a CT scan.  Patient has been having 'bloating and generalized fullness'  throughout the abdomen for over a month.  She thinks the "incisional hernia" is the cause of her bloating and feels the hernia is 'growing'.  Previous surgery includes liposuction and tummy tuck > 20 years ago.  States her abdomen is always very firm and tight without any bulges.  She has noted a decrease in bowel movements even though she has taken a laxative - very little results.  Last normal BM was last Tuesday.  Since then only small bits of firm hard stool.  There has been less gas and at times hiccups.  She did have mild nausea.  Denies any rectal bleeding or mucous.  No other changes in her routine or diet.   She did not want to go back to GI - as it was suggested that she have a repeat colonoscopy in 3 years due to poor prep on the last one in 2013.  She is due now for that repeat and has a letter from them.     Review of Systems  Constitutional: Negative for fever, chills, appetite change and fatigue.  Respiratory: Negative.   Cardiovascular: Negative.   Gastrointestinal: Positive for nausea and constipation. Negative for vomiting, abdominal pain, diarrhea, blood in stool, abdominal distention, anal bleeding and rectal pain.  Genitourinary: Negative for dysuria, urgency, frequency, hematuria, flank pain, vaginal bleeding, vaginal discharge, difficulty urinating, vaginal pain, menstrual problem and pelvic pain.  Musculoskeletal: Negative.   Skin: Negative.   Neurological: Negative.   Hematological: Negative.    Psychiatric/Behavioral: Negative.        Objective:   Physical Exam  Constitutional: She is oriented to person, place, and time. She appears well-developed and well-nourished. No distress.  Pulmonary/Chest: Effort normal.  Abdominal: Soft. Bowel sounds are normal. She exhibits no distension and no mass. There is tenderness. There is no rebound and no guarding.  Because of the abdominoplasty the area around the umbilicus is very tight without a specific bulge even with straining.  She does have a firmness around the umbilicus that is about 2 inches.  The remainder of the abdomen is generally tight that seems to be bowel / stool related.  No evidence of incarceration or torsion.  Bowel sounds are present but quite.  No rebounding. No flank pain.  Musculoskeletal: Normal range of motion.  Neurological: She is alert and oriented to person, place, and time.  Psychiatric: She has a normal mood and affect. Her behavior is normal. Judgment and thought content normal.       Assessment:     ? Incisional hernia Recent constipation and what is most likely bloating from that    Plan:     Recommendations are for a flat and upright abdominal X-ray to check bowel - constipation and gas issues.  Then once the constipation is better follow with a CT scan to better evaluate the "incisional hernia" Discussed findings and rationale with the patient and she did not want  anything except a CT done.  I then discussed findings with Dr. Sabra Heck who agreed to start with upright abdomen then go forward. While trying to get scheduled she sent a text to her daughter - who text ed back and told the pt. Not to get flat and upright abdomen and only get a CT scan.  I sent pt. on home with the explanation that I would have to consult again with Dr. Sabra Heck and we would proceed with a plan.

## 2015-05-03 NOTE — Telephone Encounter (Signed)
Attempted to schedule patient for flat and upright xray of abdomen while in office today but patient declines. Would like to proceed with CT scan. Advised per Milford Cage, FNP will need to speak with Dr.Miller regarding imaging recommendations and return call to patient. Patient is agreeable.

## 2015-05-03 NOTE — Telephone Encounter (Signed)
Patient states she has a hernia the size of a grapefruit. She was told she has to go through Dr. Sabra Heck to be referred.Patient is in a lot of pain.

## 2015-05-04 ENCOUNTER — Ambulatory Visit
Admission: RE | Admit: 2015-05-04 | Discharge: 2015-05-04 | Disposition: A | Discharge status: Home / Self Care | Payer: Medicaid Other | Source: Ambulatory Visit | Attending: Obstetrics & Gynecology | Admitting: Obstetrics & Gynecology

## 2015-05-04 DIAGNOSIS — R140 Abdominal distension (gaseous): Secondary | ICD-10-CM

## 2015-05-04 NOTE — Telephone Encounter (Signed)
Care reviewed.  I understand that the patient is due for a follow up colonoscopy with Dr. Collene Mares. I agree with the reassessment as planned with Dr. Sabra Heck.   Cc- Dr. Sabra Heck

## 2015-05-04 NOTE — Telephone Encounter (Signed)
Spoke with patient. Advised per review of x-ray with Dr.Silva patient will need to take 1 cap full of Miralax daily for 7-10 days due to large amount of stool seen in the colon. Will then need revisit with Dr.Miller for reassessment. "I really do not feel it is stool related. I feel bloated and it just does not seem right. Dr.Miller said she would order a CT scan after having the x-ray. I want to just go ahead and have the CT." Advised patient x-ray results indicate constipation and this must be treated first. Advised CT scan is not indicated at this time. Reinforced message from [place] as seen below regarding follow up care after x-ray."I have used Miralax before. I do not think this is coming from stool." Again advised of results of x-ray and need for treatment and reassessment in 7-10 days at which time further recommendations will be made if needed. Patient agreeable to plan. Follow up appointment scheduled with Dr.Miller for 6/9 at 12:45 pm. Patient is agreeable to date and time.  Routing to provider for final review.   Cc: Dr.Miller

## 2015-05-04 NOTE — Telephone Encounter (Signed)
Call to patient. Advised that per Dr Youlanda Mighty office, referral must come from pcp. Patient agreeable. She will reach out to her pcp and ask for referral to Dr Collene Mares.

## 2015-05-04 NOTE — Telephone Encounter (Signed)
Spoke with patient. Advised order for abdominal x-ray has been placed and she may be seen at [facility] between 8am and 4:30pm to have x-ray performed. Advised I am waiting to hear back from Dr.Mann's office regarding scheduling appointment for colonoscopy and I will return call with appointment date and time. Patient is agreeable.

## 2015-05-04 NOTE — Telephone Encounter (Signed)
Patient calling for results of abdominal x-ray performed today with [facility]. Advised will have Dr.Silva review results as Dr.Miller is out of the office today and return call with further recommendations.

## 2015-05-04 NOTE — Telephone Encounter (Signed)
Left message to call [facility] at [phone].  Order has been placed for abdominal x-ray. Patient may be seen at [facility] during their walk in hours from 8am to 4:30pm to have this performed. Spoke with [facility] and patient is unable to be seen with another provider at their location per their protocol unless this is approved by the MD. Damaris Schooner with Dr.Mann's office who will need faxed OV note from 5/31 for review. OV notes faxed with cover sheet for review.

## 2015-05-05 ENCOUNTER — Telehealth: Payer: Self-pay | Admitting: Obstetrics & Gynecology

## 2015-05-05 NOTE — Telephone Encounter (Signed)
Routing to [place] for review of TSH results from 05/03/2015. Patient is currently taking Synthroid 88 mcg daily.

## 2015-05-05 NOTE — Telephone Encounter (Signed)
Pt called wanting to receive results from recent thyroid testing. Pt agreeable to a call back from triage.

## 2015-05-06 NOTE — Telephone Encounter (Signed)
TSH looks like she is taking too much thyroid hormone.  Her level has bounced around quite a bit this last year.  Would recommend follow up with endocrinologist for continued management.

## 2015-05-06 NOTE — Telephone Encounter (Signed)
Spoke with patient. Advised of message as seen below from [place]. Patient is agreeable and verbalizes understanding. Will see endocrinology for follow up.  Routing to provider for final review. Patient agreeable to disposition. Will close encounter.

## 2015-05-06 NOTE — Telephone Encounter (Signed)
Pt returned call

## 2015-05-06 NOTE — Telephone Encounter (Signed)
Left message to call Kaitlyn at [phone]. 

## 2015-05-09 ENCOUNTER — Telehealth: Payer: Self-pay | Admitting: Obstetrics & Gynecology

## 2015-05-09 ENCOUNTER — Telehealth: Payer: Self-pay | Admitting: Endocrinology

## 2015-05-09 NOTE — Telephone Encounter (Signed)
Please call her and see how here symptoms are.  If she is not better, it is appropriate to proceed with additional testing now.  Specifically, it is appropriate to proceed with the CT scan.

## 2015-05-09 NOTE — Telephone Encounter (Signed)
Patient called stating that she would like to schedule her cortisol injection    Please advise patient    Thank you

## 2015-05-09 NOTE — Telephone Encounter (Signed)
Left message to call Kaitlyn at [phone]. 

## 2015-05-09 NOTE — Telephone Encounter (Addendum)
Patient called and cancelled her appointment with Dr. Sabra Heck for 05/12/15 for "9 day follow up from abdominal bloating." She said she will call back to reschedule.

## 2015-05-09 NOTE — Telephone Encounter (Signed)
Routing to Dr.Miller as FYI. Will close encounter. 

## 2015-05-11 ENCOUNTER — Ambulatory Visit: Payer: Self-pay | Admitting: Endocrinology

## 2015-05-11 ENCOUNTER — Telehealth: Payer: Self-pay | Admitting: Endocrinology

## 2015-05-11 ENCOUNTER — Encounter: Payer: Self-pay | Admitting: *Deleted

## 2015-05-11 NOTE — Telephone Encounter (Signed)
Returning call.

## 2015-05-11 NOTE — Telephone Encounter (Signed)
Patient no showed today's appt. Please advise on how to follow up. °A. No follow up necessary. °B. Follow up urgent. Contact patient immediately. °C. Follow up necessary. Contact patient and schedule visit in ___ days. °D. Follow up advised. Contact patient and schedule visit in ____weeks. ° °

## 2015-05-11 NOTE — Telephone Encounter (Signed)
Left message to call Emerly Prak at [phone]. 

## 2015-05-11 NOTE — Telephone Encounter (Signed)
Letter mailed

## 2015-05-12 ENCOUNTER — Ambulatory Visit: Payer: Medicaid Other | Admitting: Obstetrics & Gynecology

## 2015-05-12 NOTE — Telephone Encounter (Signed)
Spoke with patient. Patient states that she is doing a lot better. "I do not feel as bloated." Patient has been taking Miralax and having regular bowel movements. Denies any abdominal discomfort at this time. Advised I will let Dr.Miller know and will need to give our office a call if symptoms return or worsen. Patient is agreeable.  Routing to provider for final review. Patient agreeable to disposition. Will close encounter.   Patient aware provider will review message and nurse will return call if any additional advice or change of disposition.

## 2015-05-16 ENCOUNTER — Encounter: Payer: Self-pay | Admitting: Endocrinology

## 2015-05-16 ENCOUNTER — Ambulatory Visit (INDEPENDENT_AMBULATORY_CARE_PROVIDER_SITE_OTHER): Payer: Medicaid Other | Admitting: Endocrinology

## 2015-05-16 VITALS — BP 112/86 | HR 77 | Temp 98.3°F | Resp 16 | Ht 69.0 in | Wt 151.0 lb

## 2015-05-16 DIAGNOSIS — D7589 Other specified diseases of blood and blood-forming organs: Secondary | ICD-10-CM | POA: Diagnosis not present

## 2015-05-16 DIAGNOSIS — E038 Other specified hypothyroidism: Secondary | ICD-10-CM

## 2015-05-16 DIAGNOSIS — E063 Autoimmune thyroiditis: Secondary | ICD-10-CM

## 2015-05-16 MED ORDER — THYROID 60 MG PO TABS: 60.0000 mg | ORAL_TABLET | Freq: Every day | ORAL | Status: DC

## 2015-05-16 NOTE — Progress Notes (Signed)
Patient ID: Nancy Glenn, female   DOB: [birth year], 54 y.o.   MRN: [id number]   Chief complaint: Follow-up for thyroid  History of Present Illness: In 2010 she was evaluated by her gynecologist for symptoms of marked fatigue, lack of motivation, depression and sweating.  She was told she had hypothyroidism although baseline labs are not available on record She thinks she was started with 50 g levothyroxine supplement. However she does not think she felt much better with starting thyroid supplement Over the last few years she  had gradual increase in her thyroid dose done by her gynecologist without subjective improvement In  9/15 the dose was increased to 88 g by her gynecologist.  She was told to try Armour Thyroid instead of levothyroxine on her initial consultation but she did not do so and is now here for follow-up. She still complains of significant fatigue as before More recently her thyroid level indicates a low TSH, free T4 not done   Lab Results  Component Value Date   TSH 0.173* 05/03/2015   TSH 1.181 09/09/2014   TSH 4.817* 08/05/2014     Past Medical History  Diagnosis Date  . Arthritis     lower back  . Anxiety   . Depression   . Hypothyroidism   . Hx of migraines   . MVP (mitral valve prolapse)     a. With remote pregnancy per patient. b. 2D Echo 09/2014: normal EF, mild MR, trace pericardial effusion.  Marland Kitchen HSV-2 infection   . Abnormal Pap smear 5/10    Glandular Cells   . HPV test positive   . Normal cardiac stress test     a. Normal ETT 10/2012.  Marland Kitchen History of palpitations     Past Surgical History  Procedure Laterality Date  . Periodontal  2006  . Breast enhancement surgery  1993  . Other surgical history  2001    Tummy Tuck  . Other surgical history  06/2007    Labial Reduction/ IUD insertion--removed within 1 month of placement  . Wisdom tooth extraction      Family History  Problem Relation Age of Onset  . Colon cancer Mother 31    . Osteoporosis Mother   . Thyroid disease Mother   . Depression Mother   . Osteoporosis Maternal Grandmother   . Diabetes Maternal Grandmother   . Hyperlipidemia Maternal Grandmother   . Hypertension Father   . Breast cancer Paternal Grandmother 72  . Anxiety disorder Neg Hx   . Alcohol abuse Neg Hx   . Bipolar disorder Neg Hx   . Drug abuse Neg Hx     Social History:  reports that she has never smoked. She has never used smokeless tobacco. She reports that she drinks alcohol. She reports that she does not use illicit drugs.  Allergies:  Allergies  Allergen Reactions  . Sulfa Antibiotics Other (See Comments)    Migraines/swelling      Medication List       This list is accurate as of: 05/16/15  1:33 PM.  Always use your most recent med list.               acyclovir ointment 5 %  Commonly known as:  ZOVIRAX  APPLY TO AFFECTED AREA EVERY 3 HOURS AS DIRECTED     ALPRAZolam 1 MG tablet  Commonly known as:  XANAX  Take 0.5 tablets (0.5 mg total) by mouth 3 (three) times daily as needed for anxiety.  BRINTELLIX 20 MG Tabs  Generic drug:  Vortioxetine HBr  Take 20 mg by mouth daily.     levothyroxine 88 MCG tablet  Commonly known as:  SYNTHROID, LEVOTHROID  TAKE 1 TABLET EVERY DAY     Oxycodone HCl 10 MG Tabs  Take 1 tablet by mouth every 6 (six) hours.     valACYclovir 1000 MG tablet  Commonly known as:  VALTREX  TAKE 1 TABLET EVERY DAY     Vitamin D (Ergocalciferol) 50000 UNITS Caps capsule  Commonly known as:  DRISDOL  50,000 Units once a week.        LABS:  No visits with results within 1 Week(s) from this visit. Latest known visit with results is:  Appointment on 05/03/2015  Component Date Value Ref Range Status  . TSH 05/03/2015 0.173* 0.350 - 4.500 uIU/mL Final     REVIEW OF SYSTEMS:        She has been treated for depression by a psychiatrist but has been irregular with follow-up  She has had occasional nausea but she thinks this up  from her medication She has not had any significant weight loss or diarrhea She has not had any change in appetite which is relatively small   PHYSICAL EXAM:  BP 112/86 mmHg  Pulse 77  Temp(Src) 98.3 F (36.8 C) (Oral)  Resp 16  Ht 5\' 9"  (1.753 m)  Wt 151 lb (68.493 kg)  BMI 22.29 kg/m2  SpO2 97%  LMP 12/03/2013    Standing Blood Pressure 90/70  Biceps reflexes appear normal  ASSESSMENT:    HYPOTHYROIDISM:  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  Does not have associated goiter.   Her TSH appears to be gradually getting lower with her 88 g dose and this may be because of using a generic medication Her fatigue is unlikely to be related to her thyroid levels  Macrocytosis on lab work recently from outside lab.  This was not present previously   FATIGUE: This is likely to be from her continued depression   PLAN:  Again because of her fatigue she was given the option of trying Armour Thyroid instead of levothyroxine She did not do this previously because of not wanting to have repeated blood test done Trial of Armour Thyroid 60 mg daily for the next 6 weeks and she will come back for labs She will also be having a B-12 level done for her macrocytosis   Waylan Busta 05/16/2015, 1:33 PM

## 2015-05-25 ENCOUNTER — Telehealth: Payer: Self-pay | Admitting: Endocrinology

## 2015-05-25 NOTE — Telephone Encounter (Signed)
Please advise 

## 2015-05-25 NOTE — Telephone Encounter (Signed)
Patient called stating that she is currently getting bad headaches after starting the Armour thyroid  Can she switch back to the synthroid?  Please advise patient    Thank you

## 2015-05-27 ENCOUNTER — Other Ambulatory Visit: Payer: Self-pay | Admitting: *Deleted

## 2015-05-27 MED ORDER — LEVOTHYROXINE SODIUM 88 MCG PO TABS: 88.0000 ug | ORAL_TABLET | Freq: Every day | ORAL | Status: DC

## 2015-05-27 NOTE — Telephone Encounter (Signed)
Please see below and advise.

## 2015-05-27 NOTE — Telephone Encounter (Signed)
She can switch back to Synthroid

## 2015-06-02 ENCOUNTER — Telehealth: Payer: Self-pay | Admitting: Endocrinology

## 2015-06-02 ENCOUNTER — Other Ambulatory Visit: Payer: Self-pay | Admitting: *Deleted

## 2015-06-02 MED ORDER — THYROID 60 MG PO TABS: 60.0000 mg | ORAL_TABLET | Freq: Every day | ORAL | Status: DC

## 2015-06-02 NOTE — Telephone Encounter (Signed)
Pt called requesting Suanne Marker call her regarding medication

## 2015-06-02 NOTE — Telephone Encounter (Signed)
Patient called back requesting her armour thyroid be refilled, previously she said she was unable to take it, but now she says since she knows how she is suppose to take it, she wants to try it again.  rx sent

## 2015-06-22 ENCOUNTER — Other Ambulatory Visit: Payer: Self-pay

## 2015-06-27 ENCOUNTER — Ambulatory Visit: Payer: Self-pay | Admitting: Endocrinology

## 2015-07-05 ENCOUNTER — Other Ambulatory Visit (HOSPITAL_COMMUNITY): Payer: Self-pay | Admitting: Psychiatry

## 2015-08-16 ENCOUNTER — Other Ambulatory Visit: Payer: Self-pay | Admitting: Endocrinology

## 2015-10-24 ENCOUNTER — Ambulatory Visit (INDEPENDENT_AMBULATORY_CARE_PROVIDER_SITE_OTHER): Payer: Medicaid Other | Admitting: Obstetrics & Gynecology

## 2015-10-24 ENCOUNTER — Encounter: Payer: Self-pay | Admitting: Obstetrics & Gynecology

## 2015-10-24 ENCOUNTER — Telehealth: Payer: Self-pay

## 2015-10-24 VITALS — BP 92/56 | HR 84 | Resp 14 | Ht 68.75 in | Wt 155.0 lb

## 2015-10-24 DIAGNOSIS — Z0000 Encounter for general adult medical examination without abnormal findings: Secondary | ICD-10-CM

## 2015-10-24 DIAGNOSIS — Z124 Encounter for screening for malignant neoplasm of cervix: Secondary | ICD-10-CM

## 2015-10-24 DIAGNOSIS — Z1211 Encounter for screening for malignant neoplasm of colon: Secondary | ICD-10-CM

## 2015-10-24 DIAGNOSIS — Z205 Contact with and (suspected) exposure to viral hepatitis: Secondary | ICD-10-CM | POA: Diagnosis not present

## 2015-10-24 DIAGNOSIS — Z01419 Encounter for gynecological examination (general) (routine) without abnormal findings: Secondary | ICD-10-CM

## 2015-10-24 LAB — POCT URINALYSIS DIPSTICK
Bilirubin, UA: NEGATIVE
GLUCOSE UA: NEGATIVE
Ketones, UA: NEGATIVE
Leukocytes, UA: NEGATIVE
Nitrite, UA: NEGATIVE
PH UA: 6.5
Protein, UA: NEGATIVE
UROBILINOGEN UA: NEGATIVE

## 2015-10-24 LAB — HEMOGLOBIN, FINGERSTICK: Hemoglobin, fingerstick: 13.7 g/dL (ref 12.0–16.0)

## 2015-10-24 NOTE — Progress Notes (Signed)
Patient ID: Nancy Glenn, female   DOB: [birth year], 54 y.o.   MRN: [id number]   54 y.o. [id number] DivorcedCaucasianF here for annual exam.  No vaginal bleeding.  Pt reports that she has been released from Dr. Vira Blanco due to "non-compliance".  Pt tearful and frustrated with this.  Pt states she is not a drug addict but she has been on this for years.    Pt has not followed up with Dr. Dwyane Dee.  Needs her thyroid checked today.  Has questions about Hep C testing.  Pt reports she put her house on the market.  She is going to try and downsize this year.    Patient's last menstrual period was 12/03/2013.          Sexually active: No.  The current method of family planning is post menopausal status.    Exercising: No.  not regularly Smoker:  no  Health Maintenance: Pap: 10-22-14 WNL History of abnormal Pap:  yes MMG:  09/21/13 MMG BiRads 1-negative, 06/15/14 left diag/us-BiRads 2 Benign, 08/19/14 left diag/us-BiRads 2 Benign-bilateral screening due in one month Colonoscopy: 2013 WNL- repeat in 3 years due to family history  BMD:   08-04-12 WNL TDaP:  2014 Screening Labs: today, Hb today: 13.7, Urine today: 3+   reports that she has never smoked. She has never used smokeless tobacco. She reports that she does not drink alcohol or use illicit drugs.  Past Medical History  Diagnosis Date  . Arthritis     lower back  . Anxiety   . Depression   . Hypothyroidism   . Hx of migraines   . MVP (mitral valve prolapse)     a. With remote pregnancy per patient. b. 2D Echo 09/2014: normal EF, mild MR, trace pericardial effusion.  Marland Kitchen HSV-2 infection   . Abnormal Pap smear 5/10    Glandular Cells   . HPV test positive   . Normal cardiac stress test     a. Normal ETT 10/2012.  Marland Kitchen History of palpitations     Past Surgical History  Procedure Laterality Date  . Periodontal  2006  . Breast enhancement surgery  1993  . Other surgical history  2001    Tummy Tuck  . Other surgical history  06/2007     Labial Reduction/ IUD insertion--removed within 1 month of placement  . Wisdom tooth extraction      Current Outpatient Prescriptions  Medication Sig Dispense Refill  . acyclovir ointment (ZOVIRAX) 5 % APPLY TO AFFECTED AREA EVERY 3 HOURS AS DIRECTED 30 g 3  . ALPRAZolam (XANAX) 1 MG tablet Take 0.5 tablets (0.5 mg total) by mouth 3 (three) times daily as needed for anxiety. 90 tablet 1  . ARMOUR THYROID 60 MG tablet TAKE 1 TABLET BY MOUTH ONCE DAILY BEFORE BREAKFAST 30 tablet 0  . BRINTELLIX 20 MG TABS Take 20 mg by mouth daily.  6  . levothyroxine (SYNTHROID, LEVOTHROID) 88 MCG tablet Take 1 tablet (88 mcg total) by mouth daily. 90 tablet 0  . Oxycodone HCl 10 MG TABS Take 1 tablet by mouth every 6 (six) hours.    Marland Kitchen thyroid (ARMOUR THYROID) 60 MG tablet Take 1 tablet (60 mg total) by mouth daily before breakfast. 30 tablet 2  . valACYclovir (VALTREX) 1000 MG tablet TAKE 1 TABLET EVERY DAY 30 tablet 10  . Vitamin D, Ergocalciferol, (DRISDOL) 50000 UNITS CAPS capsule 50,000 Units once a week.    . [DISCONTINUED] CALCIUM CARBONATE PO Take 2 tablets by mouth daily.  No current facility-administered medications for this visit.    Family History  Problem Relation Age of Onset  . Colon cancer Mother 31  . Osteoporosis Mother   . Thyroid disease Mother   . Depression Mother   . Osteoporosis Maternal Grandmother   . Diabetes Maternal Grandmother   . Hyperlipidemia Maternal Grandmother   . Hypertension Father   . Breast cancer Paternal Grandmother 77  . Anxiety disorder Neg Hx   . Alcohol abuse Neg Hx   . Bipolar disorder Neg Hx   . Drug abuse Neg Hx     ROS:  Pertinent items are noted in HPI.  Otherwise, a comprehensive ROS was negative.  Exam:   BP 92/56 mmHg  Pulse 84  Resp 14  Ht 5' 8.75" (1.746 m)  Wt 155 lb (70.308 kg)  BMI 23.06 kg/m2  LMP 12/03/2013  Weight change: stable   Height: 5' 8.75" (174.6 cm)  Ht Readings from Last 3 Encounters:  10/24/15 5' 8.75" (1.746 m)   05/16/15 5\' 9"  (1.753 m)  05/03/15 5\' 9"  (1.753 m)   General appearance: alert, cooperative and appears stated age Head: Normocephalic, without obvious abnormality, atraumatic Neck: no adenopathy, supple, symmetrical, trachea midline and thyroid normal to inspection and palpation Lungs: clear to auscultation bilaterally Breasts: normal appearance, no masses or tenderness, bilateral implants Heart: regular rate and rhythm Abdomen: soft, non-tender; bowel sounds normal; no masses,  no organomegaly Extremities: extremities normal, atraumatic, no cyanosis or edema Skin: Skin color, texture, turgor normal. No rashes or lesions Lymph nodes: Cervical, supraclavicular, and axillary nodes normal. No abnormal inguinal nodes palpated Neurologic: Grossly normal   Pelvic: External genitalia:  no lesions              Urethra:  normal appearing urethra with no masses, tenderness or lesions              Bartholins and Skenes: normal                 Vagina: normal appearing vagina with normal color and discharge, no lesions              Cervix: no lesions              Pap taken: Yes.   Bimanual Exam:  Uterus:  normal size, contour, position, consistency, mobility, non-tender              Adnexa: normal adnexa and no mass, fullness, tenderness               Rectovaginal: Confirms               Anus:  normal sphincter tone, no lesions  Chaperone was present for exam.  A:  Well Woman with normal exam PMP, no HRT Chronic pain medication usageDepression. Acutely worse. Pt does have suicidal ideation. No active plan. Pt is going to go straight to [facility] now for assessment.  Osteoporosis in mother Chronic back pain on chronic pain medication. Management by Dr. Vira Blanco. H/O microscopic hematuria on dip u/a with follow up micro showing no blood.  P: Mammogram scheduled today. pap smear today. H/O neg HR HPV/neg Pap 2013. Pt and I discussed guidelines. Requests yearly Pap  smear Vit D, TSH, lipids, CMP today Hep C antibody testing today as well If TSH is abnormal, will need to see Dr. Dwyane Dee for follow up Will try to refer pt to pain management specialist return annually or prn

## 2015-10-24 NOTE — Telephone Encounter (Signed)
Thank you.  Will do referral to wherever pt desires.

## 2015-10-24 NOTE — Telephone Encounter (Signed)
Dr.Miller, I have found two locations in the area/close by that accept medicaid. The first being [facility] off [address] in [place]. [facility] states their earliest new patient appointment is not for 2-3 weeks. The second location is [facility] off [address] [facility]'s first new patient appointment is the first week of December. Both locations will need demographics, insurance, OV notes, and pain location for referral.I have left a message for the patient to return my call to discuss referral locations.

## 2015-10-25 LAB — COMPREHENSIVE METABOLIC PANEL
ALK PHOS: 51 U/L (ref 33–130)
ALT: 18 U/L (ref 6–29)
AST: 19 U/L (ref 10–35)
Albumin: 4 g/dL (ref 3.6–5.1)
BUN: 10 mg/dL (ref 7–25)
CO2: 28 mmol/L (ref 20–31)
Calcium: 9.6 mg/dL (ref 8.6–10.4)
Chloride: 102 mmol/L (ref 98–110)
Creat: 0.75 mg/dL (ref 0.50–1.05)
Glucose, Bld: 76 mg/dL (ref 65–99)
POTASSIUM: 4.8 mmol/L (ref 3.5–5.3)
Sodium: 138 mmol/L (ref 135–146)
TOTAL PROTEIN: 6.5 g/dL (ref 6.1–8.1)
Total Bilirubin: 0.5 mg/dL (ref 0.2–1.2)

## 2015-10-25 LAB — TSH: TSH: 6.351 u[IU]/mL — ABNORMAL HIGH (ref 0.350–4.500)

## 2015-10-25 LAB — LIPID PANEL
CHOL/HDL RATIO: 3 ratio (ref <=5.0)
CHOLESTEROL: 207 mg/dL — AB (ref 125–200)
HDL: 68 mg/dL (ref >=46)
LDL Cholesterol: 118 mg/dL (ref <130)
Triglycerides: 104 mg/dL (ref <150)
VLDL: 21 mg/dL (ref <30)

## 2015-10-25 LAB — VITAMIN D 25 HYDROXY (VIT D DEFICIENCY, FRACTURES): Vit D, 25-Hydroxy: 28 ng/mL — ABNORMAL LOW (ref 30–100)

## 2015-10-25 LAB — HEPATITIS C ANTIBODY: HCV Ab: NEGATIVE

## 2015-10-25 NOTE — Telephone Encounter (Signed)
Spoke with patient. Patient states that she would like to be seen at [facility] in [place]. Demographics, insurance information, and last 3 OV faxed to [facility] at [phone] with cover sheet and confirmation. Patient is aware that [facility] will contact her directly to schedule the appointment which can take up to 2-3 weeks. Patient is agreeable.  Routing to provider for final review. Patient agreeable to disposition. Will close encounter.

## 2015-10-28 LAB — IPS PAP TEST WITH REFLEX TO HPV

## 2015-10-29 ENCOUNTER — Encounter: Payer: Self-pay | Admitting: Obstetrics & Gynecology

## 2015-10-31 ENCOUNTER — Other Ambulatory Visit (INDEPENDENT_AMBULATORY_CARE_PROVIDER_SITE_OTHER): Payer: Medicaid Other

## 2015-10-31 ENCOUNTER — Telehealth: Payer: Self-pay | Admitting: Endocrinology

## 2015-10-31 ENCOUNTER — Telehealth: Payer: Self-pay | Admitting: Obstetrics & Gynecology

## 2015-10-31 DIAGNOSIS — E038 Other specified hypothyroidism: Secondary | ICD-10-CM | POA: Diagnosis not present

## 2015-10-31 DIAGNOSIS — D7589 Other specified diseases of blood and blood-forming organs: Secondary | ICD-10-CM

## 2015-10-31 DIAGNOSIS — E063 Autoimmune thyroiditis: Secondary | ICD-10-CM

## 2015-10-31 DIAGNOSIS — R87610 Atypical squamous cells of undetermined significance on cytologic smear of cervix (ASC-US): Secondary | ICD-10-CM

## 2015-10-31 DIAGNOSIS — R87810 Cervical high risk human papillomavirus (HPV) DNA test positive: Principal | ICD-10-CM

## 2015-10-31 LAB — T4, FREE: Free T4: 0.41 ng/dL — ABNORMAL LOW (ref 0.60–1.60)

## 2015-10-31 LAB — VITAMIN B12: VITAMIN B 12: 571 pg/mL (ref 211–911)

## 2015-10-31 LAB — T3, FREE: T3 FREE: 3.1 pg/mL (ref 2.3–4.2)

## 2015-10-31 NOTE — Telephone Encounter (Signed)
Please see below.

## 2015-10-31 NOTE — Telephone Encounter (Signed)
Routing to [place] for review and advise of pap smear results from 10/24/2015. Patient also sent mychart message as seen below. Dr.Miller, okay for nurse visit for urine culture or does patient need OV with in and out cath?  Visit Follow-Up Question  Message [id number]   From  Aja Bensel   To  Megan Salon, MD   Sent  10/29/2015 1:19 PM     I'd like to come back to get a repeat UA to recheck for asymptomatic hematuria given I have had RBC for two years in a row. Please let me know the results of my pap smear as soon as they come in. The thyroid results are not surprising, I have felt exhausted and flu-ish for months now.      Responsible Party    Pool - [facility] Pool No one has taken responsibility for this message.     No actions have been taken on this message.

## 2015-10-31 NOTE — Telephone Encounter (Signed)
Noted, patient is aware. 

## 2015-10-31 NOTE — Telephone Encounter (Signed)
Patient called stating that she needs to see Dr. Dwyane Dee today as she was advised by her PCP Her lab work shows her thyroid is dangerously low; may look in EPIC   Please advise

## 2015-10-31 NOTE — Telephone Encounter (Signed)
Patient calling for results.

## 2015-10-31 NOTE — Telephone Encounter (Signed)
Telephone encounter sent to [place] for review and advise.

## 2015-10-31 NOTE — Telephone Encounter (Signed)
Her thyroid is only slightly low.  She can keep her scheduled appointment and no need to worry, will discuss on her next visit  Also need to cancel TSH done today in our lab as she already had one a week ago

## 2015-11-02 NOTE — Telephone Encounter (Signed)
Spoke with patient. 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 Will return call with any additional questions. Pap smear results released to patient's mychart. Advise to return call with any additional questions or concerns.  Encounter previously closed.

## 2015-11-02 NOTE — Telephone Encounter (Signed)
Pt has abnormal Pap that I'm not sure has been called to her.  Results have been signed off.  Needs colposcopy.  She's had microscopic hematuria in the past on dip u/a but actual micros have been negative.  Will just sent micro when she comes for colonoscopy and will do a cath urine for this specimen.  Thanks.

## 2015-11-02 NOTE — Telephone Encounter (Signed)
Spoke with patient. Results given as seen below and message from [place]. Patient is agreeable. Colposcopy scheduled for 11/08/2015 at 10 am with Dr.Miller. Agreeable to date and time.   Instructions given. Motrin 800 mg po x , one hour before appointment with food. Make sure to eat a meal before appointment and drink plenty of fluids. Patient verbalized understanding and will call to reschedule if will be on menses or has any concerns regarding pregnancy. Advised will need to cancel within 24 hours or will have $150.00 late cancellation fee placed to account. Patient agreeable and verbalized understanding of all instructions. Aware she will also have repeat U/A with cath at this appointment as well. Patient is agreeable. Order for colpo placed for precert.  Notes Recorded by Megan Salon, MD on 10/31/2015 at 6:09 PM Inform pap is abnormal. Needs colposcopy. Will plan to repeat U/A when she is here and sent to lab for micro. Last year, she had blood in urine when the dip was done but micro in lab was negative. Suspect this is the same thing this year.  Cc: Theresia Lo for precert  Routing to Ahlana Slaydon for final review. Patient agreeable to disposition. Will close encounter.

## 2015-11-03 ENCOUNTER — Ambulatory Visit (INDEPENDENT_AMBULATORY_CARE_PROVIDER_SITE_OTHER): Payer: Medicaid Other | Admitting: Endocrinology

## 2015-11-03 ENCOUNTER — Telehealth: Payer: Self-pay | Admitting: *Deleted

## 2015-11-03 ENCOUNTER — Encounter: Payer: Self-pay | Admitting: Endocrinology

## 2015-11-03 VITALS — BP 122/82 | HR 90 | Temp 98.7°F | Resp 14 | Ht 68.75 in | Wt 155.0 lb

## 2015-11-03 DIAGNOSIS — E038 Other specified hypothyroidism: Secondary | ICD-10-CM

## 2015-11-03 DIAGNOSIS — E063 Autoimmune thyroiditis: Secondary | ICD-10-CM

## 2015-11-03 MED ORDER — THYROID 15 MG PO TABS: 15.0000 mg | ORAL_TABLET | Freq: Every day | ORAL | Status: DC

## 2015-11-03 NOTE — Telephone Encounter (Signed)
Patient was seen in the office.

## 2015-11-03 NOTE — Progress Notes (Signed)
Patient ID: Nancy Glenn, female   DOB: May 30, 1961, 54 y.o.   MRN: [id number]   Chief complaint: Follow-up for thyroid  History of Present Illness: In 2010 she was evaluated by her gynecologist for symptoms of marked fatigue, lack of motivation, depression and sweating.  She was told she had hypothyroidism although baseline labs are not available on record She thinks she was started with 50 g levothyroxine supplement. However she does not think she felt much better with starting thyroid supplement Over the last few years she  had gradual increase in her thyroid dose done by her gynecologist without subjective improvement In  9/15 the dose was increased to 88 g by her gynecologist  She was tried on Armour Thyroid 60 mg daily because of continuing symptoms of fatigue on her visit in 6/16 However she has not followed up until today Initially she had reported significant nausea with taking Armour Thyroid and wanted to go back to Synthroid but apparently her daughter told her to take the medication with some food and she is tolerating this well now  She thinks that her energy level improved significantly with switching to Armour Thyroid However over the last 2 months she has had more fatigue and also generalized achiness which she describes as flulike symptoms.  She is still has some difficulty sleeping at night.  No heat intolerance but occasionally gets hotter than usual She does not think she is having symptoms of depression now  Recent labs:   Lab Results  Component Value Date   TSH 6.351* 10/24/2015   TSH 0.173* 05/03/2015   TSH 1.181 09/09/2014   FREET4 0.41* 10/31/2015      Past Medical History  Diagnosis Date  . Arthritis     lower back  . Anxiety   . Depression   . Hypothyroidism   . Hx of migraines   . MVP (mitral valve prolapse)     a. With remote pregnancy per patient. b. 2D Echo 09/2014: normal EF, mild MR, trace pericardial effusion.  Marland Kitchen HSV-2 infection    . Abnormal Pap smear 5/10    Glandular Cells   . HPV test positive   . Normal cardiac stress test     a. Normal ETT 10/2012.  Marland Kitchen History of palpitations     Past Surgical History  Procedure Laterality Date  . Periodontal  2006  . Breast enhancement surgery  1993  . Other surgical history  2001    Tummy Tuck  . Other surgical history  06/2007    Labial Reduction/ IUD insertion--removed within 1 month of placement  . Wisdom tooth extraction      Family History  Problem Relation Age of Onset  . Colon cancer Mother 87  . Osteoporosis Mother   . Thyroid disease Mother   . Depression Mother   . Osteoporosis Maternal Grandmother   . Diabetes Maternal Grandmother   . Hyperlipidemia Maternal Grandmother   . Hypertension Father   . Breast cancer Paternal Grandmother 40  . Anxiety disorder Neg Hx   . Alcohol abuse Neg Hx   . Bipolar disorder Neg Hx   . Drug abuse Neg Hx     Social History:  reports that she has never smoked. She has never used smokeless tobacco. She reports that she does not drink alcohol or use illicit drugs.  Allergies:  Allergies  Allergen Reactions  . Sulfa Antibiotics Other (See Comments)    Migraines/swelling      Medication List  This list is accurate as of: 11/03/15  9:45 PM.  Always use your most recent med list.               acyclovir ointment 5 %  Commonly known as:  ZOVIRAX  APPLY TO AFFECTED AREA EVERY 3 HOURS AS DIRECTED     ALPRAZolam 1 MG tablet  Commonly known as:  XANAX  Take 0.5 tablets (0.5 mg total) by mouth 3 (three) times daily as needed for anxiety.     ARMOUR THYROID 60 MG tablet  Generic drug:  thyroid  TAKE 1 TABLET BY MOUTH ONCE DAILY BEFORE BREAKFAST     thyroid 15 MG tablet  Commonly known as:  ARMOUR THYROID  Take 1 tablet (15 mg total) by mouth daily.     BRINTELLIX 20 MG Tabs  Generic drug:  Vortioxetine HBr  Take 20 mg by mouth daily.     FLUARIX QUADRIVALENT 0.5 ML injection  Generic drug:   Influenza vac split quadrivalent PF  TO BE ADMINISTERED BY PHARMACIST FOR IMMUNIZATION     loratadine 10 MG tablet  Commonly known as:  CLARITIN  Take 10 mg by mouth daily.     ondansetron 8 MG disintegrating tablet  Commonly known as:  ZOFRAN-ODT  PLACE 1 TABLET BY MOUTH EVERY 8 HOURS FOR 10 DAYS     Oxycodone HCl 10 MG Tabs  Take 1 tablet by mouth as needed.     traZODone 100 MG tablet  Commonly known as:  DESYREL  Take 100 mg by mouth at bedtime.     valACYclovir 1000 MG tablet  Commonly known as:  VALTREX  TAKE 1 TABLET EVERY DAY     Vitamin D (Ergocalciferol) 50000 UNITS Caps capsule  Commonly known as:  DRISDOL  50,000 Units once a week.        LABS:  Lab on 10/31/2015  Component Date Value Ref Range Status  . Free T4 10/31/2015 0.41* 0.60 - 1.60 ng/dL Final  . T3, Free 10/31/2015 3.1  2.3 - 4.2 pg/mL Final  . 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  Does not have associated goiter.   Her subjective symptoms are out of proportion to her TSH levels Although she reported feeling subjectively much better with less fatigue on Armour Thyroid when this was started in June she now complains of worsening fatigue and achiness Difficult to know if this is related to hypothyroidism, objectively she looks euthyroid  TSH is mildly increased at 6.4 Explained to her that since she is on Armour Thyroid her free T4 is relatively low in comparison to the  T3  PLAN:  She will add another 15 mg Armour Thyroid to her 60 mg dose Follow-up in 68 If she is not improving clinically she will need to discuss fibromyalgia with her PCP    [facility] 11/03/2015, 9:45 PM

## 2015-11-03 NOTE — Telephone Encounter (Signed)
Patient came in today, she said someone told her that her appointment was at 2, [name] said she told her 1. Patients daughter is a Dr. And she told her mom that her thryoid is dangerously low and she could go into a coma if she does not get it adjusted. She wants to know if you can go ahead and do that since she missed her appointment? Please advise.

## 2015-11-04 ENCOUNTER — Other Ambulatory Visit: Payer: Self-pay | Admitting: Endocrinology

## 2015-11-08 ENCOUNTER — Encounter: Payer: Self-pay | Admitting: Obstetrics & Gynecology

## 2015-11-08 ENCOUNTER — Ambulatory Visit (INDEPENDENT_AMBULATORY_CARE_PROVIDER_SITE_OTHER): Payer: Medicaid Other | Admitting: Obstetrics & Gynecology

## 2015-11-08 VITALS — BP 100/60 | HR 74 | Resp 18 | Ht 68.75 in | Wt 155.0 lb

## 2015-11-08 DIAGNOSIS — R3129 Other microscopic hematuria: Secondary | ICD-10-CM | POA: Diagnosis not present

## 2015-11-08 DIAGNOSIS — R87610 Atypical squamous cells of undetermined significance on cytologic smear of cervix (ASC-US): Secondary | ICD-10-CM | POA: Diagnosis not present

## 2015-11-08 DIAGNOSIS — R87810 Cervical high risk human papillomavirus (HPV) DNA test positive: Secondary | ICD-10-CM | POA: Diagnosis not present

## 2015-11-08 NOTE — Patient Instructions (Signed)

## 2015-11-08 NOTE — Progress Notes (Signed)
Subjective:     Patient ID: Nancy Glenn, female   DOB: 02/17/1961, 54 y.o.   MRN: [id number]  HPI 54 yo G3P3 DWF here for colposcopy due to abnormal Pap smear obtained 10/24/15 showing ASCUS Pap and HR HPV.  Pt is also going to have a cath u/a today due to microscopic hematuria.    Pt has appt at new pain clinic.  Went and did her paperwork.  We figured out together talking today that I know the PA, Prince Solian, who used to see her but has moved to a headache in [place].  Pt authorizes me to ask for other suggestions and talk with Ms. Marcello Moores about pt's situation.    Patient's last menstrual period was 12/03/2013.          Sexually active: No.    Review of Systems  Constitutional: Negative.   Genitourinary: Negative for urgency, hematuria, menstrual problem and pelvic pain.  Musculoskeletal: Positive for back pain (chronic).       Objective:   Physical Exam  Constitutional: She is oriented to person, place, and time. She appears well-developed and well-nourished.  Genitourinary: Vagina normal and uterus normal. There is no rash, tenderness or lesion on the right labia. There is no rash or tenderness on the left labia. Cervix exhibits no motion tenderness. Right adnexum displays no mass, no tenderness and no fullness. Left adnexum displays no mass, no tenderness and no fullness.    Lymphadenopathy:       Right: No inguinal adenopathy present.       Left: No inguinal adenopathy present.  Neurological: She is alert and oriented to person, place, and time.   Procedure: Sterile cath u/a performed first.  Then speculum placed.  3% acetic acid applied to cervix for >45 seconds.  Cervix visualized with both 7.5X and 15X magnification.  Green filter also used.  Lugols solution was used.  Findings:  Small area of AWE at 3 o'clock.  Biopsy:  At 3 o'clock obtained.  ECC:  was performed.  Monsel's was needed.  Excellent hemostasis was present.  Pt tolerated procedure well and all instruments  were removed.  Findings noted above on picture of cervix.     Assessment:     ASCUS pap with +HR HPV H/O AGSUS pap in past with negative evaluation and normalization of pap between that and this current pap H/O microscopic hematuria on dip u/a in office     Plan:     Urine micro obtained on cath u/a specimen Cervical biopsy and ECC pending.  Results will be called to pt and if CIN1 or less, repeat Pap and HR HPV 1 year.  Pt may desire follow up in 6 months instead due to her comfort level.  Will consider for now and await results.

## 2015-11-09 LAB — URINALYSIS, MICROSCOPIC ONLY
Bacteria, UA: NONE SEEN [HPF]
Casts: NONE SEEN [LPF]
Crystals: NONE SEEN [HPF]
RBC / HPF: NONE SEEN RBC/HPF (ref <=2)
WBC, UA: NONE SEEN WBC/HPF (ref <=5)
Yeast: NONE SEEN [HPF]

## 2015-11-10 LAB — IPS OTHER TISSUE BIOPSY

## 2015-11-11 ENCOUNTER — Encounter: Payer: Self-pay | Admitting: Obstetrics & Gynecology

## 2015-11-11 ENCOUNTER — Telehealth: Payer: Self-pay | Admitting: Obstetrics & Gynecology

## 2015-11-11 NOTE — Telephone Encounter (Signed)
Spoke with patient. Advised of message as seen below from [place]. Patient is agreeable and verbalizes understanding. Would like to have a pap in 6 months. Appointment scheduled for 05/04/2016 at 2 pm with Dr.Miller. Patient is agreeable to date and time. Patient states that she was scheduled to see [facility] last week, but was called and told "Their doctors are all going out of town for the holiday. I was rescheduled to January 2nd, but I am out of medication." Patient is asking if Dr.Miller could write her a prescription until January. Advised will likely need new referral placed to see if she can be seen sooner at an alternative location.  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 Repeat pap and HR HPV in 1 year. Pt may want to come in for pap in six months as she was very anxious about this current pap smear result. Will need 08 recall. Please make sure appt is in about a year and not further out than than.   Also, please let pt know that I left a message for her prior provider, Webb Silversmith, but she has not called me back. I'll communicate with her as soon as I get a message from [place].  Dr.Miller, would you like me to place another referral to pain management for patient?

## 2015-11-11 NOTE — Telephone Encounter (Signed)
No, I do not write narcotic prescription for chronic pain medications.  I am sorry.

## 2015-11-11 NOTE — Telephone Encounter (Signed)
Results have already been sent to you.

## 2015-11-11 NOTE — Telephone Encounter (Signed)
Results to Dr.Miller for review and advise. 

## 2015-11-11 NOTE — Telephone Encounter (Signed)
Patient calling for biopsy results

## 2015-11-15 NOTE — Telephone Encounter (Signed)
Call to [facility] in [facility]. They are able to see new patients as early as 11/23/2015. They do accept Medicaid. Will need OV notes, demographics, insurance, and pain location for referral faxed to [phone].  Dr.Miller, okay to refer patient to new location for pain management so she can be seen earlier?

## 2015-11-16 NOTE — Telephone Encounter (Signed)
Spoke with patient. Advised of message as seen below from [place]. Advised new referral placed to [facility] in [facility] as they are able to get her in for an earlier appointment. OV notes, demographics, insurance, and pain location faxed with cover sheet and confirmation to [phone]. Patient is aware they will contact her to schedule her appointment.   Routing to provider for final review. Patient agreeable to disposition. Will close encounter.

## 2015-11-16 NOTE — Telephone Encounter (Signed)
Yes please proceed with referral.  Also, I spoke with her prior provider Prince Solian) who suggested the same office.  Thanks.

## 2015-11-23 ENCOUNTER — Telehealth: Payer: Self-pay | Admitting: Obstetrics & Gynecology

## 2015-11-23 NOTE — Telephone Encounter (Signed)
A referral was sent in to to the [facility] clinic for patient and she says it's been over a week and she still hasn't heard anything from their office. Patient checking up on the status of this. Best # to reach [phone]

## 2015-11-23 NOTE — Telephone Encounter (Signed)
Spoke with [facility] in [place]. Referral was faxed on 11/16/2015. Per [place] they have tried to contact the patient with no return call. Catalina [place] states he will try to contact the patient again to schedule. The patient may also contact their office to set up an appointment. Spoke with patient advised of message from the Pain clinic. Provided the telephone number to [facility] in [facility] [phone]. Patient is agreeable and will contact them to schedule.  Routing to provider for final review. Patient agreeable to disposition. Will close encounter.

## 2015-11-24 DIAGNOSIS — G8929 Other chronic pain: Secondary | ICD-10-CM | POA: Insufficient documentation

## 2015-11-24 DIAGNOSIS — M792 Neuralgia and neuritis, unspecified: Secondary | ICD-10-CM | POA: Insufficient documentation

## 2015-11-24 DIAGNOSIS — M545 Low back pain: Secondary | ICD-10-CM

## 2015-12-03 ENCOUNTER — Other Ambulatory Visit: Payer: Self-pay | Admitting: Endocrinology

## 2015-12-13 ENCOUNTER — Other Ambulatory Visit: Payer: Self-pay

## 2015-12-15 ENCOUNTER — Ambulatory Visit: Payer: Self-pay | Admitting: Endocrinology

## 2015-12-26 ENCOUNTER — Telehealth: Payer: Self-pay | Admitting: Obstetrics & Gynecology

## 2015-12-26 DIAGNOSIS — Z1211 Encounter for screening for malignant neoplasm of colon: Secondary | ICD-10-CM

## 2015-12-26 NOTE — Telephone Encounter (Signed)
Dr. Sabra Heck,  Is it okay to place order for fecal occult blood test kit?

## 2015-12-26 NOTE — Telephone Encounter (Signed)
Patient is coming by in about an hour to pick up a ifob kit.

## 2015-12-26 NOTE — Telephone Encounter (Signed)
I put the order in for Nancy Glenn.  However, she really is due a colonoscopy as her last one was in 2013 and follow up was recommended in three years.  Does she need [place] to refer her to GI?  IFOB is not a replacement for colonoscopy.

## 2015-12-27 NOTE — Telephone Encounter (Signed)
Spoke with patient and message from Dr. Sabra Heck given.  Patient aware she is due for colonoscopy.  Last Colonoscopy completed at [facility].  Patient given their number to call and schedule. She will get referral from PCP because she has medicaid and states she must have referral from PCP.  Routing to provider for final review. Patient agreeable to disposition. Will close encounter.

## 2016-01-07 ENCOUNTER — Other Ambulatory Visit: Payer: Self-pay | Admitting: Obstetrics & Gynecology

## 2016-01-09 NOTE — Telephone Encounter (Signed)
Medication refill request: valtrex Last AEX:  10/24/15 SM Next AEX: 02/15/17 SM Last MMG (if hormonal medication request): 08/19/14 [place] Left BIRADS2:Benign  Refill authorized: 01/04/15 #30/10 R. Today please advise .

## 2016-01-11 ENCOUNTER — Telehealth: Payer: Self-pay | Admitting: Obstetrics & Gynecology

## 2016-01-11 NOTE — Telephone Encounter (Signed)
Erroneous encounter

## 2016-01-23 ENCOUNTER — Encounter: Payer: Self-pay | Admitting: Obstetrics & Gynecology

## 2016-02-03 NOTE — Telephone Encounter (Signed)
Pt did not return IFOB sample.  Letter was sent.

## 2016-02-15 ENCOUNTER — Telehealth: Payer: Self-pay

## 2016-02-15 NOTE — Telephone Encounter (Signed)
Left message to call [place] at [phone].  Need to advise patient there was too much stool in her sample for IFOB testing. As a result IFOB testing could not be resulted. Patient will need to perform another IFOB test and will need review of instructions.

## 2016-02-15 NOTE — Telephone Encounter (Signed)
Spoke with patient. Advised there was too much stool in her sample for IFOB testing and as a result testing could not be resulted. Patient is agreeable to perform another collection. Advised I will place a new IFOB kit at the front for pick up at her convenience. She is agreeable. Reviewed instructions for IFOB testing kit. She is agreeable and verbalizes understanding.  Routing to provider for final review. Patient agreeable to disposition. Will close encounter.

## 2016-02-28 LAB — FECAL OCCULT BLOOD, IMMUNOCHEMICAL: IFOBT: NEGATIVE

## 2016-02-28 NOTE — Addendum Note (Signed)
Addended by: Graylon Good on: 02/28/2016 01:21 PM   Modules accepted: Orders, SmartSet

## 2016-03-05 IMAGING — CR DG ABDOMEN 2V
2 series · 2 of 2 positions shown · non-contrast
Comparison: None.

CLINICAL DATA: Abdominal pain, bloating for 3 months

EXAM:
ABDOMEN - 2 VIEW

[w abdomen upright]
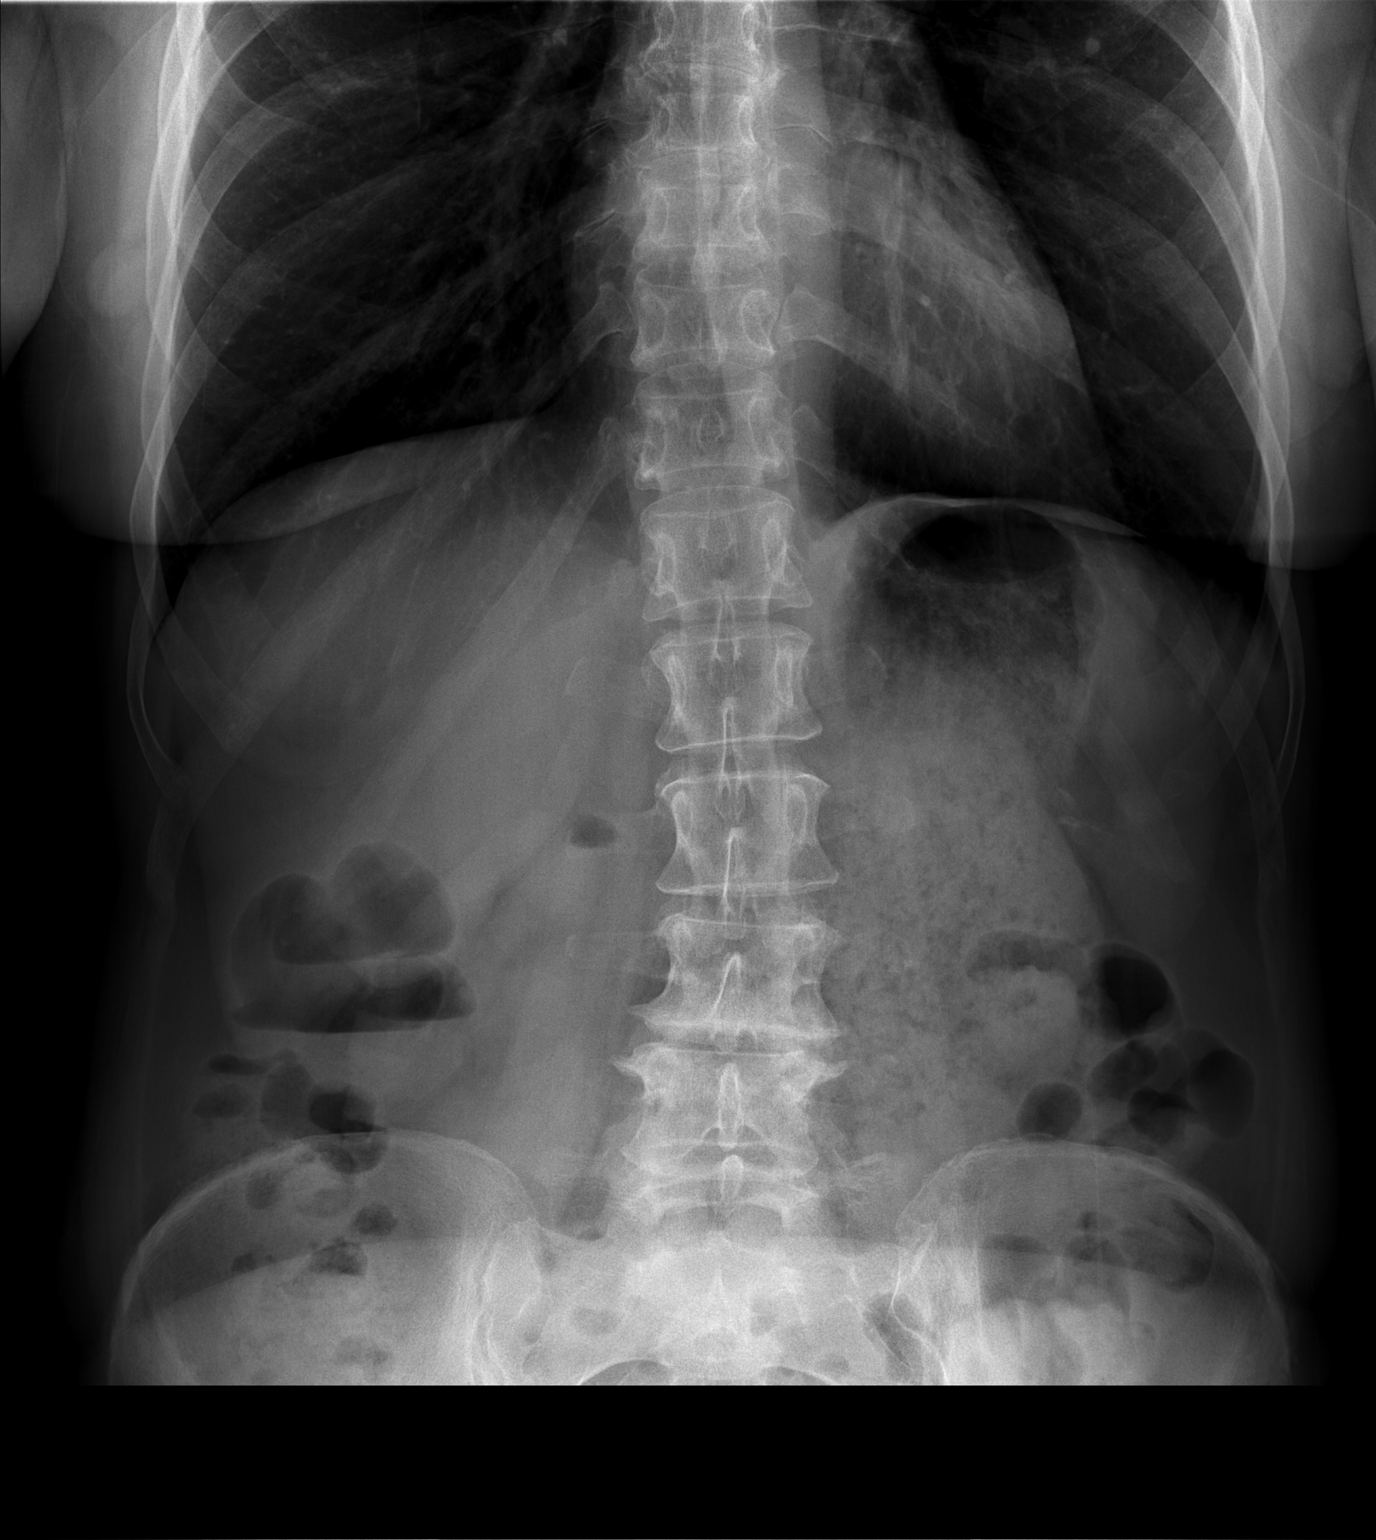

[t abdomen supine]
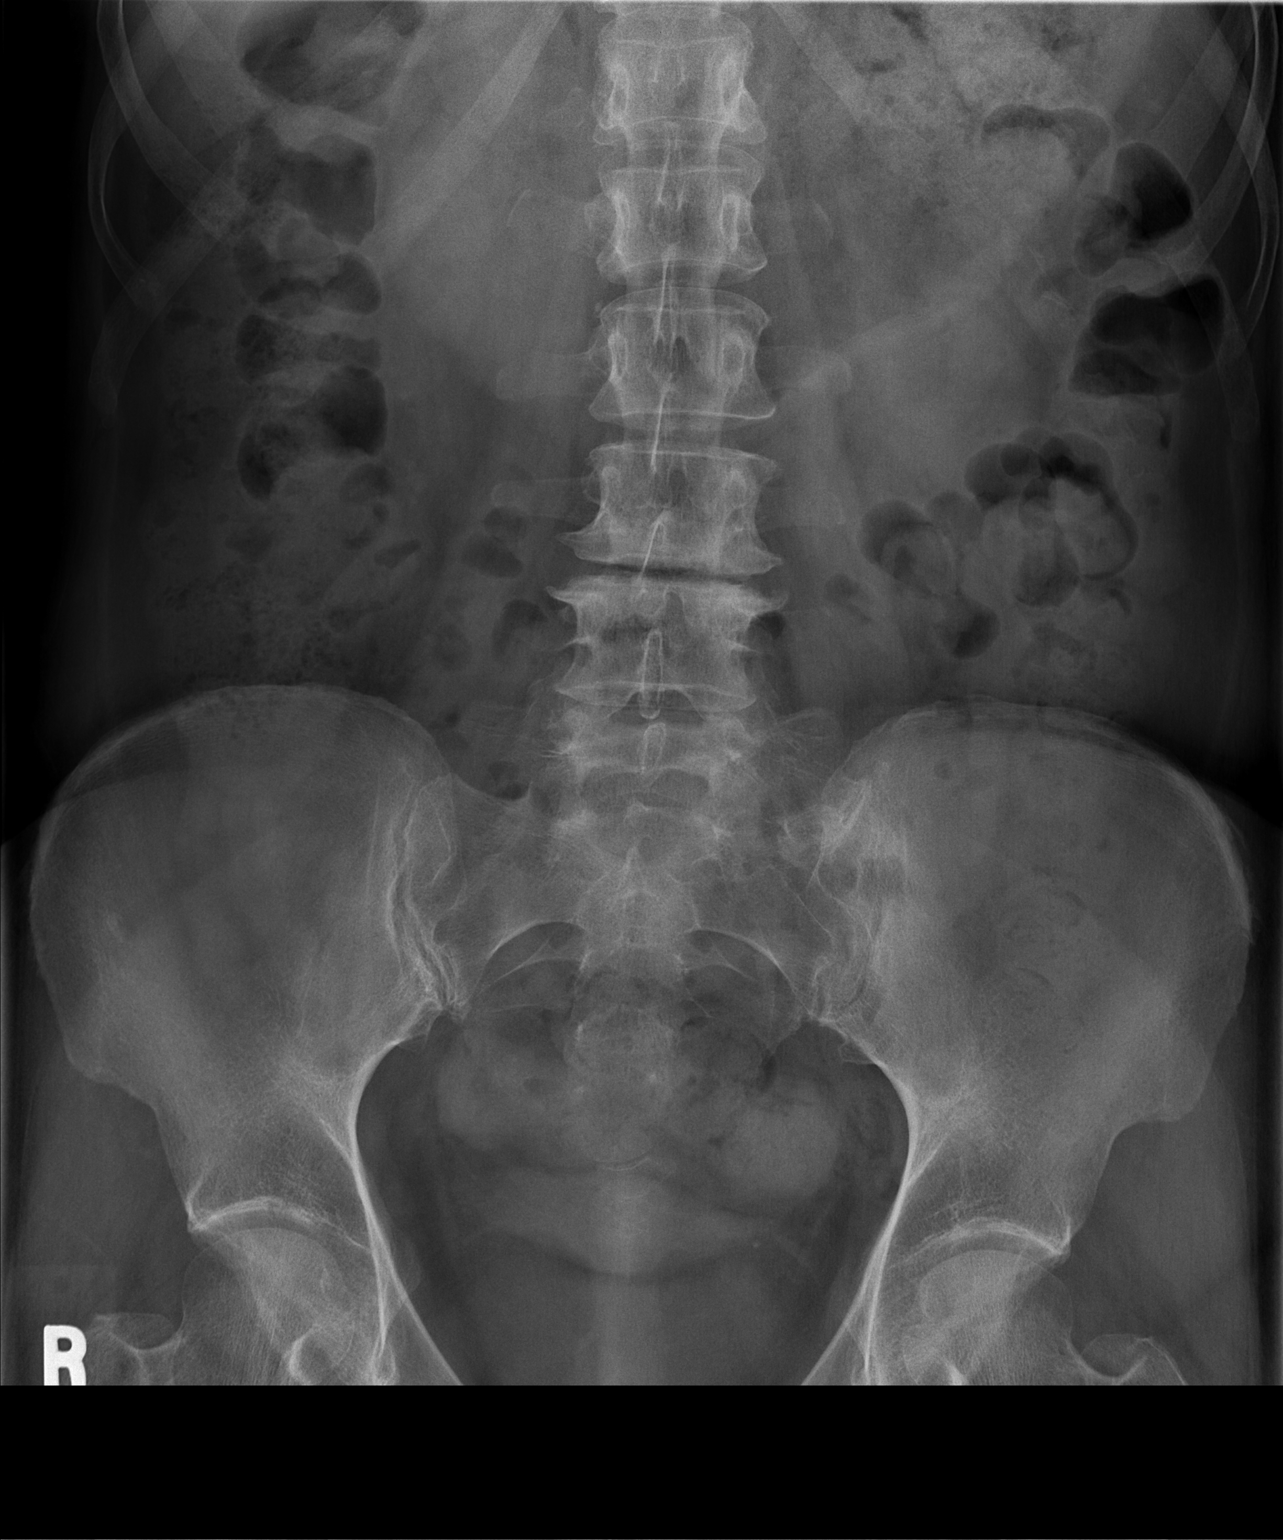

[2 of 2 positions shown; findings below may reference images not displayed]

FINDINGS: Normal small bowel gas pattern. Some colonic stool noted in right
colon and transverse colon. Debris noted within stomach.
IMPRESSION: Normal small bowel gas pattern.  Colonic stool in proximal colon.

## 2016-03-09 ENCOUNTER — Encounter: Payer: Self-pay | Admitting: Obstetrics & Gynecology

## 2016-03-09 ENCOUNTER — Telehealth: Payer: Self-pay | Admitting: Obstetrics & Gynecology

## 2016-03-09 ENCOUNTER — Ambulatory Visit (INDEPENDENT_AMBULATORY_CARE_PROVIDER_SITE_OTHER): Payer: Medicaid Other | Admitting: Obstetrics & Gynecology

## 2016-03-09 VITALS — BP 86/60 | HR 86 | Resp 18 | Ht 68.75 in | Wt 154.0 lb

## 2016-03-09 DIAGNOSIS — N950 Postmenopausal bleeding: Secondary | ICD-10-CM | POA: Diagnosis not present

## 2016-03-09 DIAGNOSIS — N6452 Nipple discharge: Secondary | ICD-10-CM

## 2016-03-09 NOTE — Progress Notes (Signed)
Bilateral diagnostic mammogram and possible ultrasound if needed scheduled for 03-19-16 at 220 pm at the [facility]. Patient agreeable to date and time.

## 2016-03-09 NOTE — Telephone Encounter (Signed)
Patient is having abnormal bleeding. Chart to triage. °

## 2016-03-09 NOTE — Telephone Encounter (Signed)
Spoke with patient. Patient states that she began to vaginal bleeding this morning "It is like I am having my period, but I have not had my period in years." Reports she is having mild cramping. Denies any sharp pelvic pain. Patient is tearful as she is concerned. Denies any heavy bleeding. "It is about like a normal cycle." Advised patient she will need to be seen in the office today for further evaluation. She is agreeable. Appointment scheduled for today 03/09/2016 at 11:15 am with Dr.Miller. She is agreeable to date and time.  Routing to provider for final review. Patient agreeable to disposition. Will close encounter.

## 2016-03-10 LAB — PROLACTIN: Prolactin: 21.6 ng/mL

## 2016-03-10 LAB — ESTRADIOL: Estradiol: 59 pg/mL

## 2016-03-13 ENCOUNTER — Other Ambulatory Visit: Payer: Self-pay | Admitting: Obstetrics & Gynecology

## 2016-03-13 ENCOUNTER — Telehealth: Payer: Self-pay | Admitting: *Deleted

## 2016-03-13 DIAGNOSIS — N6452 Nipple discharge: Secondary | ICD-10-CM

## 2016-03-13 NOTE — Telephone Encounter (Signed)
Please let pt know her estradiol level was 59.  This is elevated for menopause which means she made a little estrogen and this is probably why she had the bleeding.  The biopsy is not back yet.    Her prolactin level is 21. This is mildly elevated.  I did draw it after I'd done her breast exam so it needs to be repeated without any breast or nipple stimulation for at least 12 hours prior.  Please put on lab schedule for am lab.  Order has been placed.  Thanks.  These should not have any effect on having an MRI.  She did not tell me about this so why is she having the MRI?

## 2016-03-13 NOTE — Telephone Encounter (Signed)
Patient calling checking on results from last visit and if these results could be released through [facility]. She hasn;t had her MRI just yet because her doctor's office is waiting for these results to be released. Also asked if we could fax over her results to [facility]. Best # to reach: [phone]

## 2016-03-13 NOTE — Telephone Encounter (Signed)
Patient called back to confirm blood work has been faxed to [facility] faxed by Wyatt Portela at 11:59 AM, patient is aware.

## 2016-03-13 NOTE — Progress Notes (Signed)
Patient ID: Nancy Glenn, female   DOB: [birth year], 55 y.o.   MRN: [id number]  GYNECOLOGY  VISIT   HPI: 55 y.o. [id number] Divorced Caucasian female with complaint of bright red vaginal bleeding that started today.  She is having mild cramping.  Bleeding is just like a prior cycle.  Pt's LMP was 2015 and she is not on HRT.  Pt also reports she is having some "crustiness" on her nipples.  She feels like she is having some nipple discharge.  Has not seen anything in her bra.  Does not think there is blood.  Last MMG 9/15.  Denies trauma or pain.  Denies bruising, masses or skin changes.  GYNECOLOGIC HISTORY: Patient's last menstrual period was 12/03/2013. Menopausal hormone therapy: none  Patient Active Problem List   Diagnosis Date Noted  . Major depressive disorder, recurrent episode, moderate ([place]) 11/04/2014  . GAD (generalized anxiety disorder) 11/04/2014  . Chronic low blood pressure 10/20/2014  . DOE (dyspnea on exertion) 09/24/2014  . Abnormal EKG 09/24/2014  . Fatigue 09/16/2014  . MVP (mitral valve prolapse)   . History of palpitations   . Hypothyroidism   . Cervical spondylosis without myelopathy 09/17/2012  . Lumbar facet arthropathy 02/26/2012  . Myofascial muscle pain 02/26/2012    Past Medical History  Diagnosis Date  . Arthritis     lower back  . Anxiety   . Depression   . Hypothyroidism   . Hx of migraines   . MVP (mitral valve prolapse)     a. With remote pregnancy per patient. b. 2D Echo 09/2014: normal EF, mild MR, trace pericardial effusion.  Marland Kitchen HSV-2 infection   . Abnormal Pap smear 5/10    Glandular Cells   . HPV test positive   . Normal cardiac stress test     a. Normal ETT 10/2012.  Marland Kitchen History of palpitations     Past Surgical History  Procedure Laterality Date  . Periodontal  2006  . Breast enhancement surgery  1993  . Other surgical history  2001    Tummy Tuck  . Other surgical history  06/2007    Labial Reduction/ IUD insertion--removed within  1 month of placement  . Wisdom tooth extraction      MEDS:  Reviewed in EPIC and UTD  ALLERGIES: Sulfa antibiotics  Family History  Problem Relation Age of Onset  . Colon cancer Mother 3  . Osteoporosis Mother   . Thyroid disease Mother   . Depression Mother   . Osteoporosis Maternal Grandmother   . Diabetes Maternal Grandmother   . Hyperlipidemia Maternal Grandmother   . Hypertension Father   . Breast cancer Paternal Grandmother 75  . Anxiety disorder Neg Hx   . Alcohol abuse Neg Hx   . Bipolar disorder Neg Hx   . Drug abuse Neg Hx     SH:  Divorced, non smoker  Review of Systems  All other systems reviewed and are negative.   PHYSICAL EXAMINATION:    BP 86/60 mmHg  Pulse 86  Resp 18  Ht 5' 8.75" (1.746 m)  Wt 154 lb (69.854 kg)  BMI 22.91 kg/m2  LMP 12/03/2013    General appearance: alert, cooperative and appears stated age Neck: no adenopathy, supple, symmetrical, trachea midline and thyroid normal to inspection and palpation Breasts: normal appearance, no masses or tenderness, No nipple retraction or dimpling.  Clear nipple discharge from several ducts noted on left breast and milky discharge from one duct noted on right breast. Abdomen: soft,  non-tender; bowel sounds normal; no masses,  no organomegaly  Pelvic: External genitalia:  no lesions              Urethra:  normal appearing urethra with no masses, tenderness or lesions              Bartholins and Skenes: normal                 Vagina: normal appearing vagina with normal color and discharge, no lesions              Cervix: no lesions              Bimanual Exam:  Uterus:  normal size, contour, position, consistency, mobility, non-tender              Adnexa: no mass, fullness, tenderness              Anus:  normal sphincter tone, no lesions  Endometrial biopsy recommended.  Discussed with patient.  Verbal and written consent obtained.   Procedure:  Speculum placed.  Cervix visualized and cleansed with  betadine prep.  A single toothed tenaculum was applied to the anterior lip of the cervix.  Endometrial pipelle was advanced through the cervix into the endometrial cavity without difficulty.  Pipelle passed to 7.5cm.  Suction applied and pipelle removed with good tissue sample obtained.  Tenculum removed.  No bleeding noted.  Patient tolerated procedure well.  Chaperone was present for exam.  Assessment: PMP bleeding in pt not on HRT Clear nipple discharge on the left and milky discharge on the right  Plan: Biopsy will be sent the pathology Estradiol and prolactin will be obtained today Diagnostic bilateral MMG with ultrasound if needed

## 2016-03-13 NOTE — Telephone Encounter (Signed)
Spoke with patient. Advised of message as seen below from [place]. She verbalizes understanding. States that she was worried so she contacted her PCP requesting to have a brain MRI. She was seen with her PCP and had her Prolactin level rechecked. She states her Prolactin level with her PCP was normal. She will have these results faxed to our office for Dr.Miller's review. Reports her PCP advised an MRI of the brain is not needed at this time. Advised I will notify Dr.Miller. Aware that her biopsy results are still pending and she will be contacted as soon as these return. She is agreeable.

## 2016-03-13 NOTE — Telephone Encounter (Signed)
Routing to [place] for review and advise of lab results from 03/09/2016.

## 2016-03-14 ENCOUNTER — Telehealth: Payer: Self-pay | Admitting: Obstetrics & Gynecology

## 2016-03-14 ENCOUNTER — Other Ambulatory Visit: Payer: Self-pay | Admitting: Obstetrics & Gynecology

## 2016-03-14 NOTE — Telephone Encounter (Signed)
Spoke with patient. Advised patient biopsy results are still pending. Advised it can take up to 7 days for these to return. Advised as soon as her results return Dr.Miller will review them and we will be in contact with her to discuss the results. She is agreeable.  Routing to provider for final review. Patient agreeable to disposition. Will close encounter.

## 2016-03-14 NOTE — Telephone Encounter (Signed)
Patient is asking for recent biopsy result. Last seen 03/09/16.

## 2016-03-15 ENCOUNTER — Telehealth: Payer: Self-pay

## 2016-03-15 ENCOUNTER — Encounter: Payer: Self-pay | Admitting: Obstetrics & Gynecology

## 2016-03-15 NOTE — Telephone Encounter (Signed)
Please contact pt.          Vinnie Level    ----- Message -----     From: Mickel Fuchs     Sent: 03/15/2016  3:39 PM      To: [facility] Clinical Pool    Subject: Visit Follow-Up Question                   Uvaldo Rising at [facility] urgent CARE is still waiting on copy of test results. Prolactin and estrogen. Please send. Katlyn told me she sent Tuesday    Spoke with patient. Advised patient per our last phone conversation I thought she was going to have her follow up Prolactin level that was drawn with her PCP sent to our office. Advised I will be happy to send her lab results to her PCP at this time. Requested that she have her labs from her PCP sent to our office for Dr.Miller's review. She is agreeable and will contact her PCP's office. Advised of results as seen below. She is agreeable and verbalizes understanding.  Labs from 03/09/2016 faxed to [facility] urgent care attention Uvaldo Rising with cover sheet and confirmation.  Routing to provider for final review. Patient agreeable to disposition. Will close encounter.

## 2016-03-15 NOTE — Telephone Encounter (Signed)
-----   Message from Megan Salon, MD sent at 03/15/2016  3:53 PM EDT ----- Please let pt know her biopsy was negative.  Nothing else needs to be done for the bleeding as long as it stopped/stops.

## 2016-03-15 NOTE — Telephone Encounter (Signed)
Spoke with patient. Please see telephone encounter dated with today's date. Will close encounter.

## 2016-03-16 ENCOUNTER — Ambulatory Visit
Admission: RE | Admit: 2016-03-16 | Discharge: 2016-03-16 | Disposition: A | Discharge status: Home / Self Care | Payer: Medicaid Other | Source: Ambulatory Visit | Attending: Obstetrics & Gynecology | Admitting: Obstetrics & Gynecology

## 2016-03-16 DIAGNOSIS — N6452 Nipple discharge: Secondary | ICD-10-CM

## 2016-03-19 ENCOUNTER — Other Ambulatory Visit: Payer: Self-pay | Admitting: Endocrinology

## 2016-03-20 ENCOUNTER — Encounter: Payer: Self-pay | Admitting: Internal Medicine

## 2016-03-20 ENCOUNTER — Encounter: Payer: Self-pay | Admitting: *Deleted

## 2016-04-10 ENCOUNTER — Other Ambulatory Visit: Payer: Self-pay

## 2016-04-12 IMAGING — MG MM DIAG BREAST TOMO UNI LEFT
7 series · 8 of 15 positions shown · non-contrast
Comparison: 09/21/2013, and 08/14/2012, 08/03/2011

CLINICAL DATA: Pain 6 o'clock left breast for about 6 months

EXAM:
DIGITAL DIAGNOSTIC LEFT MAMMOGRAM WITH IMPLANTS, 3D TOMOSYNTHESIS
AND CAD
ULTRASOUND LEFT BREAST
The patient has retropectoral implants. Standard and implant
displaced views were performed.

[L MLO (1 of 2)]
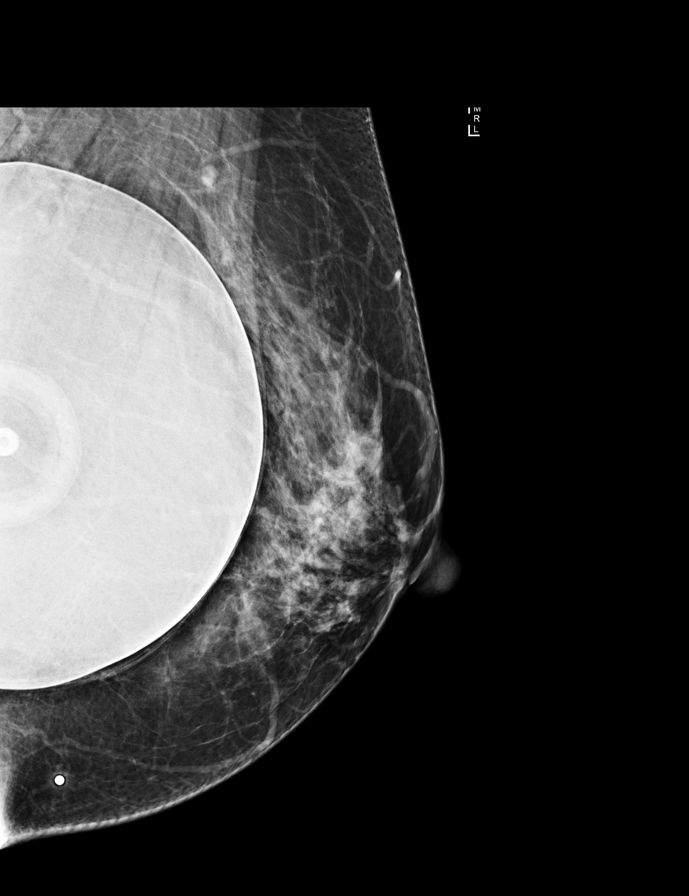

[L TAN]
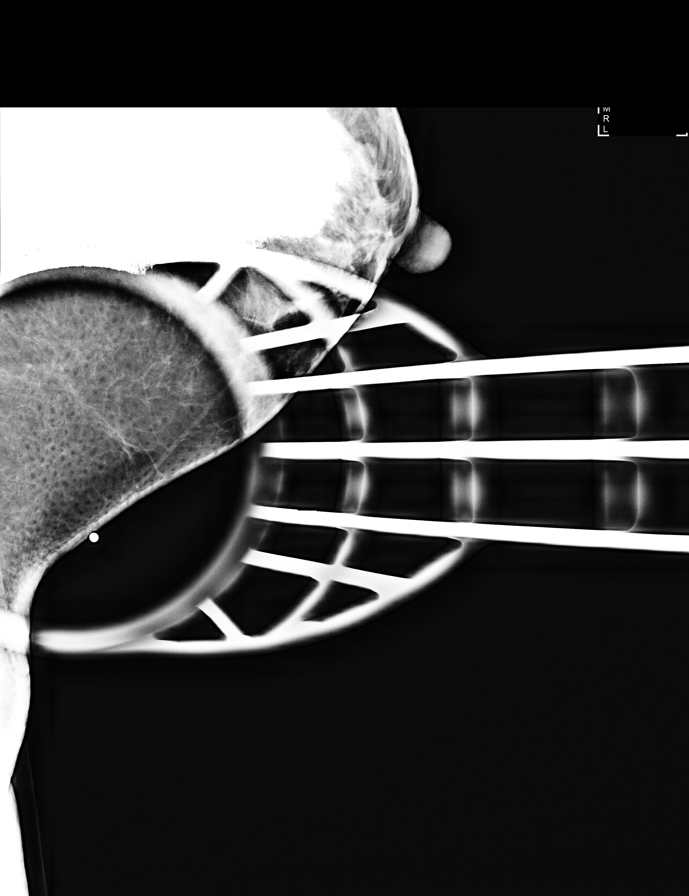

[L CC (1 of 2)]
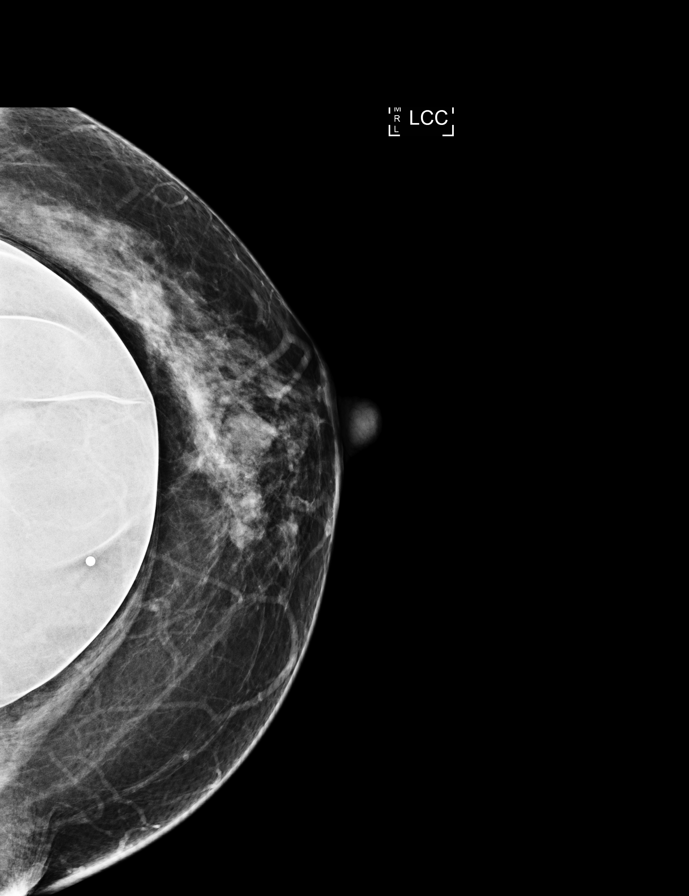

[L MLO (2 of 2)]
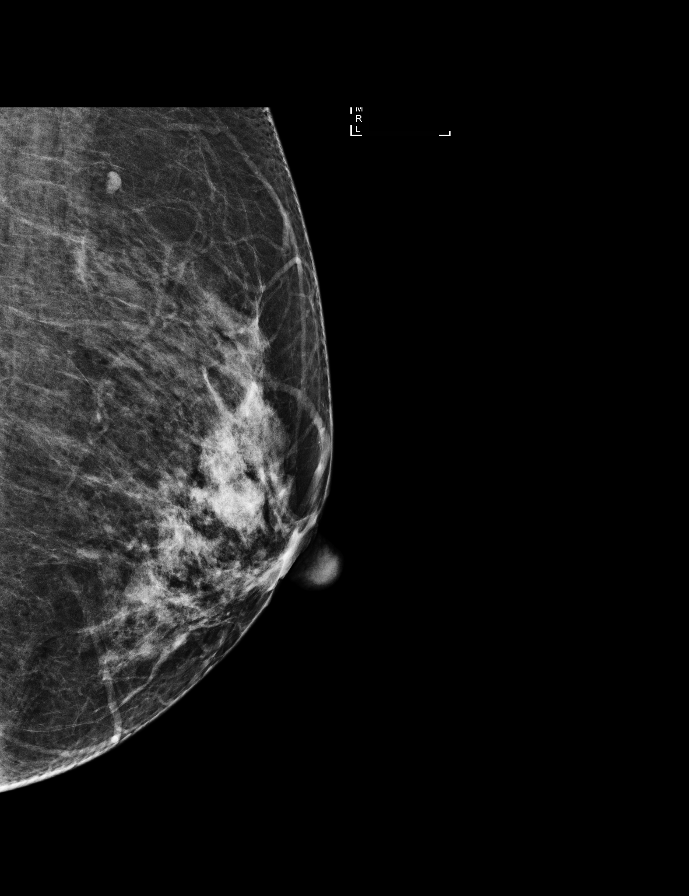

[L CC (2 of 2)]
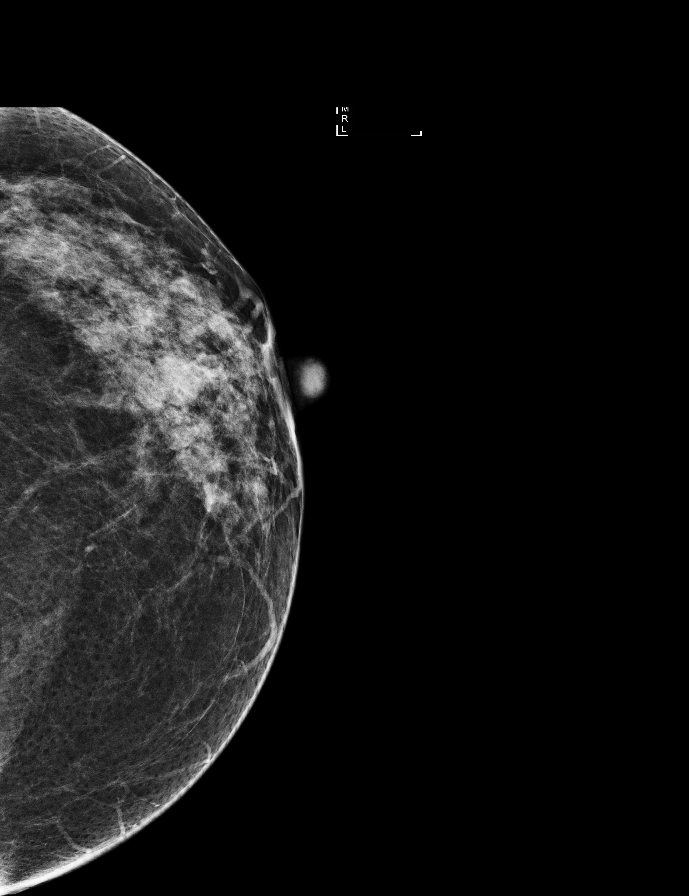

[L CC tomo · 2 of 50 frames shown]
[frame 17/50]
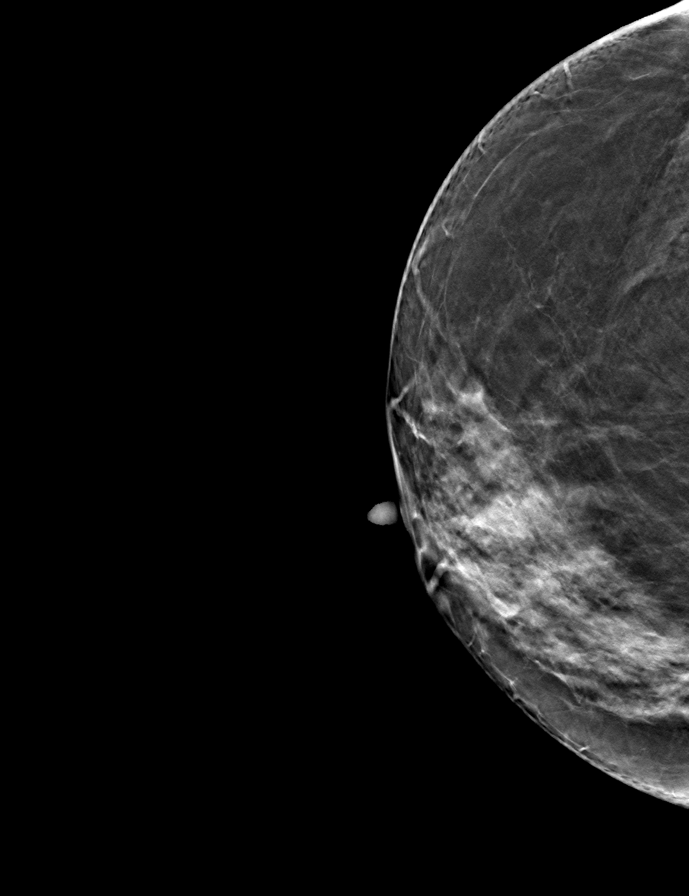
[frame 25/50]
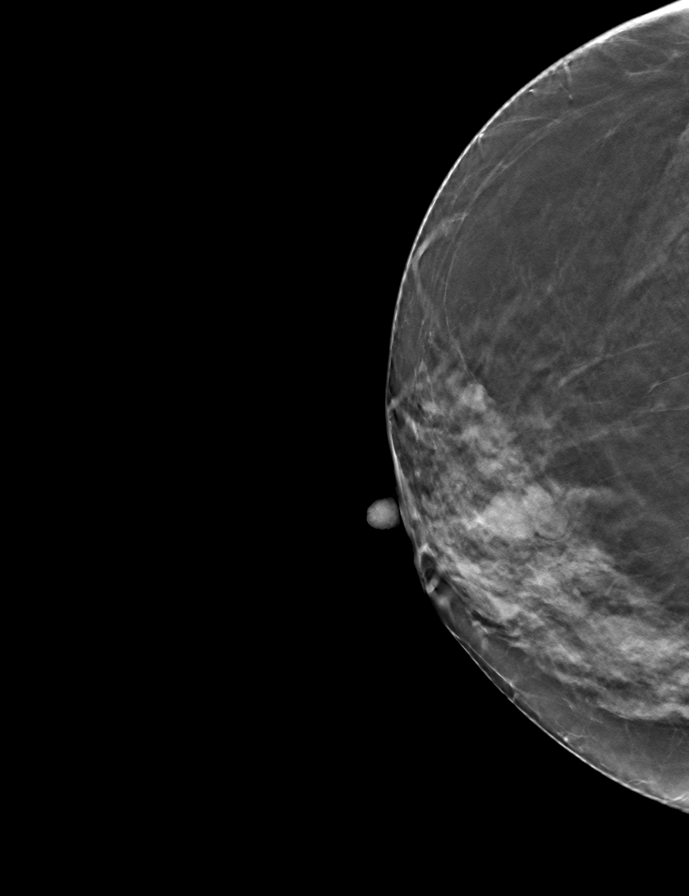

[L MLO tomo · tomo slice 29/56.0]
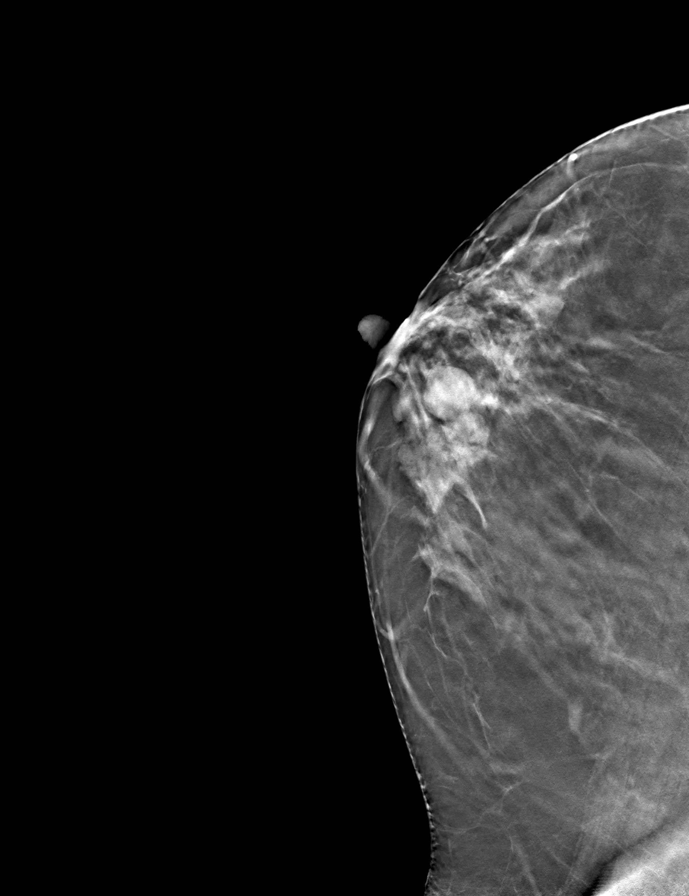

[8 of 15 positions shown; findings below may reference images not displayed]

ACR Breast Density Category c: The breast tissue is heterogeneously
dense, which may obscure small masses.
FINDINGS: There is an obscured round 1.5 cm mass in the subareolar 12 o'clock
position of the right breast. There are no other significant
findings. There are no abnormalities in the 6 o'clock position.

On physical exam, there are no palpable abnormalities.

Ultrasound is performed, showing numerous simple cysts in the
superior periareolar left breast. The largest simple cyst measures
14 mm. There are no abnormalities in the 4 o'clock through 8 o'clock
position.

Mammographic images were processed with CAD.
IMPRESSION: No abnormalities to account for tenderness in the inferior left
breast. Incidental note of several simple cysts in the periareolar
area.

RECOMMENDATION:
Screening bilateral mammogram in 3 months

I have discussed the findings and recommendations with the patient.
Results were also provided in writing at the conclusion of the
visit. If applicable, a reminder letter will be sent to the patient
regarding the next appointment.

BI-RADS CATEGORY  2: Benign.

## 2016-04-13 ENCOUNTER — Ambulatory Visit: Payer: Self-pay | Admitting: Endocrinology

## 2016-04-20 ENCOUNTER — Telehealth: Payer: Self-pay

## 2016-04-20 NOTE — Telephone Encounter (Signed)
Dr Hilarie Fredrickson,      Would you like a 2 day prep?  2013 colon report: FAIR prep with MOVIprep.                                                                                                                                   Thank you, Angela/PV

## 2016-04-22 NOTE — Telephone Encounter (Signed)
Yes, 2 day prep please

## 2016-05-04 ENCOUNTER — Encounter: Payer: Self-pay | Admitting: Obstetrics & Gynecology

## 2016-05-04 ENCOUNTER — Ambulatory Visit: Payer: Medicaid Other | Admitting: Obstetrics & Gynecology

## 2016-05-04 ENCOUNTER — Telehealth: Payer: Self-pay | Admitting: Obstetrics & Gynecology

## 2016-05-04 NOTE — Telephone Encounter (Signed)
Called patient to reschedule her missed appointment for a six month pap with Dr. Sabra Heck today. Patient stated, "I cancelled this appointment by replying to the text message reminder. I'll have to call back to reschedule."  Did not count as a missed appointment  Patient advised reply's to the text message reminders do not come to our office and to please call directly or press "2" at her reminder call. Patient appreciative.

## 2016-05-07 NOTE — Telephone Encounter (Signed)
Did pt actually reschedule the appt?  I do not see a new appt in the appt desk.  Thanks.

## 2016-05-08 NOTE — Telephone Encounter (Signed)
She is in recall for one year.  Pt desired pap earlier per her preference.  Pt aware recommendation is for one year follow up.  Ok to let her decide and close encounter.  Thanks.

## 2016-05-08 NOTE — Telephone Encounter (Signed)
Patient declined to reschedule at that time stating, "I'll have to call back to reschedule."  Please advise on follow up. I am happy to call her back at a later date if she does not call back to reschedule within the next week or so.

## 2016-05-09 ENCOUNTER — Encounter: Payer: Self-pay | Admitting: Internal Medicine

## 2016-05-09 ENCOUNTER — Other Ambulatory Visit: Payer: Self-pay | Admitting: Endocrinology

## 2016-08-09 ENCOUNTER — Encounter: Payer: Self-pay | Admitting: Internal Medicine

## 2016-08-17 ENCOUNTER — Other Ambulatory Visit: Payer: Self-pay | Admitting: Obstetrics & Gynecology

## 2016-08-17 NOTE — Telephone Encounter (Signed)
Medication refill request: Valacyclovir Last AEX:  1121/16 SM Next AEX: 02/15/17 SM Last MMG (if hormonal medication request): 03/16/16 BIRADS1 Refill authorized: 01/09/16 #30 5R. Please advise. Thank you.

## 2016-09-10 ENCOUNTER — Encounter: Payer: Self-pay | Admitting: Obstetrics & Gynecology

## 2016-09-11 ENCOUNTER — Ambulatory Visit (AMBULATORY_SURGERY_CENTER): Payer: Self-pay | Admitting: *Deleted

## 2016-09-11 VITALS — Ht 69.0 in | Wt 157.0 lb

## 2016-09-11 DIAGNOSIS — Z800 Family history of malignant neoplasm of digestive organs: Secondary | ICD-10-CM

## 2016-09-11 MED ORDER — NA SULFATE-K SULFATE-MG SULF 17.5-3.13-1.6 GM/177ML PO SOLN: ORAL | 0 refills | Status: DC

## 2016-09-11 NOTE — Progress Notes (Signed)
Patient denies any allergies to eggs or soy. Patient denies any problems with anesthesia/sedation. Patient denies any oxygen use at home and does not take any diet/weight loss medications.  

## 2016-09-12 ENCOUNTER — Encounter: Payer: Self-pay | Admitting: Obstetrics & Gynecology

## 2016-09-18 ENCOUNTER — Telehealth: Payer: Self-pay | Admitting: *Deleted

## 2016-09-18 NOTE — Telephone Encounter (Signed)
Patient called in wanting to know if she could start her prep now. She states the worst part of procedure last time was being up all night long. I advised her that I thought it was a bit early to start now but she could start around 2pm so that she could get some rest tonight and then complete her prep tomorrow morning starting at 530 am with nothing to drink after 730 am.

## 2016-09-19 ENCOUNTER — Ambulatory Visit (AMBULATORY_SURGERY_CENTER): Payer: Medicaid Other | Admitting: Internal Medicine

## 2016-09-19 ENCOUNTER — Encounter: Payer: Self-pay | Admitting: Internal Medicine

## 2016-09-19 VITALS — BP 110/69 | HR 72 | Temp 97.5°F | Resp 25 | Ht 69.0 in | Wt 157.0 lb

## 2016-09-19 DIAGNOSIS — Z1212 Encounter for screening for malignant neoplasm of rectum: Secondary | ICD-10-CM | POA: Diagnosis not present

## 2016-09-19 DIAGNOSIS — D123 Benign neoplasm of transverse colon: Secondary | ICD-10-CM | POA: Diagnosis not present

## 2016-09-19 DIAGNOSIS — Z1211 Encounter for screening for malignant neoplasm of colon: Secondary | ICD-10-CM | POA: Diagnosis not present

## 2016-09-19 DIAGNOSIS — Z800 Family history of malignant neoplasm of digestive organs: Secondary | ICD-10-CM | POA: Diagnosis not present

## 2016-09-19 MED ORDER — SODIUM CHLORIDE 0.9 % IV SOLN: 500.0000 mL | INTRAVENOUS | Status: DC

## 2016-09-19 NOTE — Progress Notes (Signed)
Called to room to assist during endoscopic procedure.  Patient ID and intended procedure confirmed with present staff. Received instructions for my participation in the procedure from the performing physician.  

## 2016-09-19 NOTE — Patient Instructions (Signed)
HANDOUTS GIVEN : POLYPS, DIVERTICULOSIS, HEMORRHOIDS.  YOU HAD AN ENDOSCOPIC PROCEDURE TODAY AT [facility]:   Refer to the procedure report that was given to you for any specific questions about what was found during the examination.  If the procedure report does not answer your questions, please call your gastroenterologist to clarify.  If you requested that your care partner not be given the details of your procedure findings, then the procedure report has been included in a sealed envelope for you to review at your convenience later.  YOU SHOULD EXPECT: Some feelings of bloating in the abdomen. Passage of more gas than usual.  Walking can help get rid of the air that was put into your GI tract during the procedure and reduce the bloating. If you had a lower endoscopy (such as a colonoscopy or flexible sigmoidoscopy) you may notice spotting of blood in your stool or on the toilet paper. If you underwent a bowel prep for your procedure, you may not have a normal bowel movement for a few days.  Please Note:  You might notice some irritation and congestion in your nose or some drainage.  This is from the oxygen used during your procedure.  There is no need for concern and it should clear up in a day or so.  SYMPTOMS TO REPORT IMMEDIATELY:   Following lower endoscopy (colonoscopy or flexible sigmoidoscopy):  Excessive amounts of blood in the stool  Significant tenderness or worsening of abdominal pains  Swelling of the abdomen that is new, acute  Fever of 100F or higher   For urgent or emergent issues, a gastroenterologist can be reached at any hour by calling [phone].   DIET:  We do recommend a small meal at first, but then you may proceed to your regular diet.  Drink plenty of fluids but you should avoid alcoholic beverages for 24 hours.  ACTIVITY:  You should plan to take it easy for the rest of today and you should NOT DRIVE or use heavy machinery until tomorrow  (because of the sedation medicines used during the test).    FOLLOW UP: Our staff will call the number listed on your records the next business day following your procedure to check on you and address any questions or concerns that you may have regarding the information given to you following your procedure. If we do not reach you, we will leave a message.  However, if you are feeling well and you are not experiencing any problems, there is no need to return our call.  We will assume that you have returned to your regular daily activities without incident.  If any biopsies were taken you will be contacted by phone or by letter within the next 1-3 weeks.  Please call us at [phone] if you have not heard about the biopsies in 3 weeks.    SIGNATURES/CONFIDENTIALITY: You and/or your care partner have signed paperwork which will be entered into your electronic medical record.  These signatures attest to the fact that that the information above on your After Visit Summary has been reviewed and is understood.  Full responsibility of the confidentiality of this discharge information lies with you and/or your care-partner.

## 2016-09-19 NOTE — Progress Notes (Signed)
Report to PACU, RN, vss, BBS= Clear.  

## 2016-09-19 NOTE — Op Note (Signed)
[place] Patient Name: Nancy Glenn Procedure Date: 09/19/2016 10:13 AM MRN: [id number] Endoscopist: Jerene Bears , MD Age: 55 Referring MD:  Date of Birth: [birth year] Gender: Female Account #: [id number] Procedure:                Colonoscopy Indications:              Screening in patient at increased risk: Family                            history of 1st-degree relative with colorectal                            cancer, Last colonoscopy: February 2013 Medicines:                Monitored Anesthesia Care Procedure:                Pre-Anesthesia Assessment:                           - Prior to the procedure, a History and Physical                            was performed, and patient medications and                            allergies were reviewed. The patient's tolerance of                            previous anesthesia was also reviewed. The risks                            and benefits of the procedure and the sedation                            options and risks were discussed with the patient.                            All questions were answered, and informed consent                            was obtained. Prior Anticoagulants: The patient has                            taken no previous anticoagulant or antiplatelet                            agents. ASA Grade Assessment: II - A patient with                            mild systemic disease. After reviewing the risks                            and benefits, the patient was deemed in  satisfactory condition to undergo the procedure.                           After obtaining informed consent, the colonoscope                            was passed under direct vision. Throughout the                            procedure, the patient's blood pressure, pulse, and                            oxygen saturations were monitored continuously. The                            Model PCF-H190DL  [id number]) scope was introduced                            through the anus and advanced to the the cecum,                            identified by appendiceal orifice and ileocecal                            valve. The colonoscopy was performed without                            difficulty. The patient tolerated the procedure                            well. The quality of the bowel preparation was                            good. The ileocecal valve, appendiceal orifice, and                            rectum were photographed. The bowel preparation                            used was 2 day Suprep. Scope In: 10:17:13 AM Scope Out: 10:33:24 AM Scope Withdrawal Time: 0 hours 11 minutes 52 seconds  Total Procedure Duration: 0 hours 16 minutes 11 seconds  Findings:                 The digital rectal exam was normal.                           A 6 mm polyp was found in the transverse colon. The                            polyp was sessile. The polyp was removed with a                            cold snare. Resection and retrieval were complete.  A few small-mouthed diverticula were found in the                            sigmoid colon.                           Internal hemorrhoids were found during                            retroflexion. The hemorrhoids were small. Complications:            No immediate complications. Estimated Blood Loss:     Estimated blood loss was minimal. Impression:               - One 6 mm polyp in the transverse colon, removed                            with a cold snare. Resected and retrieved.                           - Mild diverticulosis in the sigmoid colon.                           - Internal hemorrhoids. Recommendation:           - Patient has a contact number available for                            emergencies. The signs and symptoms of potential                            delayed complications were discussed with the                             patient. Return to normal activities tomorrow.                            Written discharge instructions were provided to the                            patient.                           - Resume previous diet.                           - Continue present medications.                           - Await pathology results.                           - Repeat colonoscopy in 5 years for surveillance. Jerene Bears, MD 09/19/2016 10:36:41 AM This report has been signed electronically.

## 2016-09-20 ENCOUNTER — Telehealth: Payer: Self-pay

## 2016-09-20 ENCOUNTER — Encounter: Payer: Self-pay | Admitting: Obstetrics & Gynecology

## 2016-09-20 ENCOUNTER — Telehealth: Payer: Self-pay | Admitting: *Deleted

## 2016-09-20 NOTE — Telephone Encounter (Signed)
  Follow up Call-  Call back number 09/19/2016  Post procedure Call Back phone  # [phone] cell  Permission to leave phone message Yes  Some recent data might be hidden     Patient questions:  Do you have a fever, pain , or abdominal swelling? No. Pain Score  0 *  Have you tolerated food without any problems? Yes.    Have you been able to return to your normal activities? Yes.    Do you have any questions about your discharge instructions: Diet   No. Medications  No. Follow up visit  No.  Do you have questions or concerns about your Care? No.  Actions: * If pain score is 4 or above: No action needed, pain <4.

## 2016-09-20 NOTE — Telephone Encounter (Signed)
  Follow up Call-  Call back number 09/19/2016  Post procedure Call Back phone  # [phone] cell  Permission to leave phone message Yes  Some recent data might be hidden     Patient questions:  Message left to call us if necessary.

## 2016-09-21 ENCOUNTER — Encounter: Payer: Self-pay | Admitting: Internal Medicine

## 2016-09-24 ENCOUNTER — Encounter: Payer: Self-pay | Admitting: Internal Medicine

## 2016-09-25 ENCOUNTER — Encounter: Payer: Self-pay | Admitting: Internal Medicine

## 2016-10-11 ENCOUNTER — Other Ambulatory Visit: Payer: Self-pay | Admitting: Surgery

## 2016-10-11 DIAGNOSIS — R1900 Intra-abdominal and pelvic swelling, mass and lump, unspecified site: Secondary | ICD-10-CM

## 2016-10-16 ENCOUNTER — Ambulatory Visit
Admission: RE | Admit: 2016-10-16 | Discharge: 2016-10-16 | Disposition: A | Discharge status: Home / Self Care | Payer: Medicaid Other | Source: Ambulatory Visit | Attending: Surgery | Admitting: Surgery

## 2016-10-16 DIAGNOSIS — R1900 Intra-abdominal and pelvic swelling, mass and lump, unspecified site: Secondary | ICD-10-CM

## 2016-10-16 MED ORDER — IOPAMIDOL (ISOVUE-300) INJECTION 61%
100.0000 mL | Freq: Once | INTRAVENOUS | Status: AC | PRN
  Administered 2016-10-16: 100 mL via INTRAVENOUS

## 2016-11-14 ENCOUNTER — Encounter: Payer: Self-pay | Admitting: Obstetrics & Gynecology

## 2016-11-14 ENCOUNTER — Telehealth: Payer: Self-pay

## 2016-11-14 DIAGNOSIS — E2839 Other primary ovarian failure: Secondary | ICD-10-CM

## 2016-11-14 DIAGNOSIS — Z780 Asymptomatic menopausal state: Secondary | ICD-10-CM

## 2016-11-14 NOTE — Telephone Encounter (Signed)
I have the same information that you have.  Before I make any recommendations about treatment, I need to get a copy of her most recent BMD.  Can you see if pt can get that to [place].  Thanks.

## 2016-11-14 NOTE — Telephone Encounter (Signed)
Telephone encounter created to review with Dr.Miller. 

## 2016-11-14 NOTE — Telephone Encounter (Signed)
Last BMD was performed in 08/2012 WNL. I do not see history of osteopenia or osteoporosis noted. Routing to [facility] for review and advise.  From Nancy Glenn To Nancy Salon, MD Sent 11/14/2016 3:31 PM  Osteoporosis. I understand I have the beginning stages however I have seen a tv commercial about a shot given twice a year to women to slow down the progression and I think it said it even helps to rebuild. The past several months my knees have been bothering me going down the stairs at home. Do you have any knowledge of this shot for osteoporosis ?   Responsible Party   Pool - [facility] No one has taken responsibility for this message.  No actions have been taken on this message

## 2016-11-15 NOTE — Telephone Encounter (Signed)
Spoke with patient. Patient states that she has not had a recent BMD exam. Advised will need recent BMD for Dr.Miller to make additional recommendations. Patient is agreeable and would like to proceed with scheduling. Advised I will contact the [facility] and return call with appointment date and time. Patient is agreeable.  Spoke with Museum/gallery conservator at the [employer]. BMD scheduled for 11/22/2016 at 3 pm with 2:40 pm arrival.   Spoke with patient. Provided appointment date and time for BMD as seen above. Patient is unable to keep this appointment due to another appointment that day. Offered to reschedule appointment for the patient. Patient declines stating she will [facility] directly to reschedule.  Routing to provider for final review. Patient agreeable to disposition. Will close encounter.

## 2016-11-22 ENCOUNTER — Other Ambulatory Visit: Payer: Self-pay

## 2016-11-27 ENCOUNTER — Ambulatory Visit
Admission: RE | Admit: 2016-11-27 | Discharge: 2016-11-27 | Disposition: A | Discharge status: Home / Self Care | Payer: Medicaid Other | Source: Ambulatory Visit | Attending: Obstetrics & Gynecology | Admitting: Obstetrics & Gynecology

## 2016-11-27 DIAGNOSIS — Z780 Asymptomatic menopausal state: Secondary | ICD-10-CM

## 2016-11-27 DIAGNOSIS — E2839 Other primary ovarian failure: Secondary | ICD-10-CM

## 2016-12-04 ENCOUNTER — Telehealth: Payer: Self-pay | Admitting: *Deleted

## 2016-12-04 NOTE — Telephone Encounter (Signed)
Patient in 08 recall for 11/2016. She is scheduled for an AEX 01/2017. Please check schedule and contact patient and try to bring patient in sooner. Thanks

## 2016-12-05 ENCOUNTER — Encounter: Payer: Self-pay | Admitting: Obstetrics & Gynecology

## 2016-12-06 NOTE — Telephone Encounter (Signed)
Call to patient. Patient agreeable to reschedule AEX. Patient moved to Thursday 12/13/16 at 1345. Patient agreeable to date and time of appointment.

## 2016-12-13 ENCOUNTER — Ambulatory Visit: Payer: Self-pay | Admitting: Obstetrics & Gynecology

## 2016-12-13 ENCOUNTER — Encounter: Payer: Self-pay | Admitting: Obstetrics & Gynecology

## 2016-12-13 VITALS — BP 102/66 | HR 76 | Resp 14 | Ht 69.0 in | Wt 154.6 lb

## 2016-12-13 DIAGNOSIS — Z0000 Encounter for general adult medical examination without abnormal findings: Secondary | ICD-10-CM | POA: Diagnosis not present

## 2016-12-13 DIAGNOSIS — E039 Hypothyroidism, unspecified: Secondary | ICD-10-CM

## 2016-12-13 DIAGNOSIS — Z01419 Encounter for gynecological examination (general) (routine) without abnormal findings: Secondary | ICD-10-CM | POA: Diagnosis not present

## 2016-12-13 DIAGNOSIS — E559 Vitamin D deficiency, unspecified: Secondary | ICD-10-CM

## 2016-12-13 DIAGNOSIS — B977 Papillomavirus as the cause of diseases classified elsewhere: Secondary | ICD-10-CM

## 2016-12-13 LAB — TSH: TSH: 5 mIU/L — ABNORMAL HIGH

## 2016-12-13 MED ORDER — OXYCODONE HCL 10 MG PO TABS: 10.0000 mg | ORAL_TABLET | Freq: Three times a day (TID) | ORAL | 0 refills | Status: AC

## 2016-12-13 MED ORDER — VITAMIN D (ERGOCALCIFEROL) 1.25 MG (50000 UNIT) PO CAPS: 50000.0000 [IU] | ORAL_CAPSULE | ORAL | 4 refills | Status: DC

## 2016-12-13 MED ORDER — VALACYCLOVIR HCL 1 G PO TABS: 1000.0000 mg | ORAL_TABLET | Freq: Every day | ORAL | 4 refills | Status: DC

## 2016-12-13 NOTE — Progress Notes (Signed)
56 y.o. [id number] Divorced Caucasian F here for annual exam.  No vaginal bleeding.  Doing well.    PCP:  Everardo Beals, [facility].  Patient's last menstrual period was 12/03/2013.          Sexually active: No.  The current method of family planning is post menopausal.    Exercising: No.  The patient does not participate in regular exercise at present. Smoker:  no  Health Maintenance: Pap:  10/24/15 ASCUS, + HR HPV  History of abnormal Pap:  yes MMG:  03/16/16 BIRADS 1 negative  Colonoscopy:  09/19/16 polyp- repeat 3 years BMD:   11/27/16 osteopenia  TDaP:  09/21/13 Pneumonia vaccine(s):  12/03/12  Zostavax:   Unsure  Hep C testing: 10/24/15 negative  Screening Labs: drawn today, Hb today: same, Urine today: declined   reports that she has never smoked. She has never used smokeless tobacco. She reports that she does not drink alcohol or use drugs.  Past Medical History:  Diagnosis Date  . 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 Anxiety   . Arthritis    lower back  . Depression   . History of palpitations   . HPV test positive   . HSV-2 infection   . Hx of migraines   . Hypothyroidism   . MVP (mitral valve prolapse)    a. With remote pregnancy per patient. b. 2D Echo 09/2014: normal EF, mild MR, trace pericardial effusion.  . Normal cardiac stress test    a. Normal ETT 10/2012.    Past Surgical History:  Procedure Laterality Date  . BREAST ENHANCEMENT SURGERY  1993  . OTHER SURGICAL HISTORY  2001   Tummy Tuck  . OTHER SURGICAL HISTORY  06/2007   Labial Reduction/ IUD insertion--removed within 1 month of placement  . periodontal  2006  . WISDOM TOOTH EXTRACTION      Current Outpatient Prescriptions  Medication Sig Dispense Refill  . ALPRAZolam (XANAX) 1 MG tablet Take 0.5 tablets (0.5 mg total) by mouth 3 (three) times daily as needed for anxiety. 90 tablet 1  . ARMOUR THYROID 15 MG tablet TAKE 1 TABLET (15 MG TOTAL) BY MOUTH DAILY. *PT NEEDS  TO BE SEEN FOR FULL REFILL** 15 tablet 0  . ARMOUR THYROID 60 MG tablet TAKE 1 TABLET BY MOUTH EVERY DAY BEFORE BREAKFAST 15 tablet 0  . cetirizine (ZYRTEC) 10 MG tablet Take 10 mg by mouth daily.  6  . fluticasone (FLONASE) 50 MCG/ACT nasal spray Place 1 spray into both nostrils daily.  3  . meloxicam (MOBIC) 15 MG tablet Take 15 mg by mouth daily.  1  . MOVANTIK 25 MG TABS tablet Take 25 mg by mouth daily.  1  . oxyCODONE-acetaminophen (PERCOCET) 10-325 MG tablet     . OXYCONTIN 15 MG 12 hr tablet Take 10 mg by mouth 4 (four) times daily. Reported on 03/09/2016  0  . PROAIR HFA 108 (90 Base) MCG/ACT inhaler INHALE 2 PUFFS EVERY 4 HOURS AS NEEDED FOR COUGH AND WHEEZING  3  . traZODone (DESYREL) 100 MG tablet Take 25 mg by mouth at bedtime.   3  . valACYclovir (VALTREX) 1000 MG tablet Take 1,000 mg by mouth daily.  5  . Vitamin D, Ergocalciferol, (DRISDOL) 50000 UNITS CAPS capsule 50,000 Units once a week.    Marland Kitchen VYVANSE 30 MG capsule Take 30 mg by mouth daily.  0   No current facility-administered medications for this visit.  Family History  Problem Relation Age of Onset  . Colon cancer Mother 35  . Osteoporosis Mother   . Thyroid disease Mother   . Depression Mother   . Osteoporosis Maternal Grandmother   . Diabetes Maternal Grandmother   . Hyperlipidemia Maternal Grandmother   . Hypertension Father   . Breast cancer Paternal Grandmother 45  . Anxiety disorder Neg Hx   . Alcohol abuse Neg Hx   . Bipolar disorder Neg Hx   . Drug abuse Neg Hx     ROS:  Pertinent items are noted in HPI.  Otherwise, a comprehensive ROS was negative.  Exam:   BP 102/66 (BP Location: Right Arm, Patient Position: Sitting, Cuff Size: Normal)   Pulse 76   Resp 14   Ht 5\' 9"  (1.753 m)   Wt 154 lb 9.6 oz (70.1 kg)   LMP 12/03/2013 Comment: 9 months ago  BMI 22.83 kg/m   Weight change: -1#  Height: 5\' 9"  (175.3 cm)  Ht Readings from Last 3 Encounters:  12/13/16 5\' 9"  (1.753 m)  09/19/16 5\' 9"   (1.753 m)  09/11/16 5\' 9"  (1.753 m)    General appearance: alert, cooperative and appears stated age Head: Normocephalic, without obvious abnormality, atraumatic Neck: no adenopathy, supple, symmetrical, trachea midline and thyroid normal to inspection and palpation Lungs: clear to auscultation bilaterally Breasts: normal appearance, no masses or tenderness, bilateral implants present Heart: regular rate and rhythm Abdomen: soft, non-tender; bowel sounds normal; no masses,  no organomegaly Extremities: extremities normal, atraumatic, no cyanosis or edema Skin: Skin color, texture, turgor normal. No rashes or lesions Lymph nodes: Cervical, supraclavicular, and axillary nodes normal. No abnormal inguinal nodes palpated Neurologic: Grossly normal   Pelvic: External genitalia:  no lesions              Urethra:  normal appearing urethra with no masses, tenderness or lesions              Bartholins and Skenes: normal                 Vagina: normal appearing vagina with normal color and discharge, no lesions              Cervix: no lesions              Pap taken: Yes.   Bimanual Exam:  Uterus:  normal size, contour, position, consistency, mobility, non-tender              Adnexa: normal adnexa and no mass, fullness, tenderness               Rectovaginal: Confirms               Anus:  normal sphincter tone, no lesions  Chaperone was present for exam.  A:    Well Woman with normal exam PMP, no HRT Depression that is improved this past year Osteoporosis in mother Osteopenia in pt Chronic back pain on chronic pain medication   P: Mammogram scheduled today. Pap and HR HPV obtained today Vit D, TSH Valtrex 1 gram daily.  #90/4RF Vit D 50K 1 capsule weekly #12/4RF Return annually or prn

## 2016-12-14 LAB — VITAMIN D 25 HYDROXY (VIT D DEFICIENCY, FRACTURES): VIT D 25 HYDROXY: 32 ng/mL (ref 30–100)

## 2016-12-17 ENCOUNTER — Encounter: Payer: Self-pay | Admitting: Obstetrics & Gynecology

## 2016-12-18 LAB — IPS PAP TEST WITH HPV

## 2016-12-21 ENCOUNTER — Encounter: Payer: Self-pay | Admitting: Obstetrics & Gynecology

## 2016-12-24 ENCOUNTER — Telehealth: Payer: Self-pay | Admitting: *Deleted

## 2016-12-24 NOTE — Telephone Encounter (Signed)
See telephone encounter created 12/24/16.

## 2016-12-24 NOTE — Telephone Encounter (Signed)
Spoke with patient. Patient states she was not sure who to f/u with for thyroid medication changes? Advised patient to f/u with pcp. Advised recent results to pcp as requested. Advised patient to contact pcp for scheduling. Patient verbalizes understanding and is agreeable.   Request for ROI processed.   From Mickel Fuchs To Megan Salon, MD Sent 12/21/2016 12:36 PM  I have a question about TSH resulted on 12/13/16 at 10:03 PM.  Please send results of ALL RESULTS TO KIM MILSAP at [facility] urgent care. As usual. She is my primary    Routing to provider for final review. Patient is agreeable to disposition. Will close encounter.

## 2017-02-15 ENCOUNTER — Ambulatory Visit: Payer: Medicaid Other | Admitting: Obstetrics & Gynecology

## 2017-02-15 ENCOUNTER — Other Ambulatory Visit: Payer: Self-pay | Admitting: Obstetrics & Gynecology

## 2017-02-15 DIAGNOSIS — Z1231 Encounter for screening mammogram for malignant neoplasm of breast: Secondary | ICD-10-CM

## 2017-03-18 ENCOUNTER — Ambulatory Visit
Admission: RE | Admit: 2017-03-18 | Discharge: 2017-03-18 | Disposition: A | Discharge status: Home / Self Care | Payer: Medicaid Other | Source: Ambulatory Visit | Attending: Obstetrics & Gynecology | Admitting: Obstetrics & Gynecology

## 2017-03-18 DIAGNOSIS — Z1231 Encounter for screening mammogram for malignant neoplasm of breast: Secondary | ICD-10-CM

## 2017-04-08 ENCOUNTER — Other Ambulatory Visit: Payer: Self-pay | Admitting: Obstetrics & Gynecology

## 2017-07-02 ENCOUNTER — Telehealth: Payer: Self-pay | Admitting: Obstetrics & Gynecology

## 2017-07-02 NOTE — Telephone Encounter (Signed)
Patient requesting a refill for acyclovir ointment to be sent to [facility] on [address] at [phone].

## 2017-07-02 NOTE — Telephone Encounter (Signed)
Medication refill request: acyclovir oint. (not on current med list)  Last AEX:  12-13-16  Next AEX: 03-14-18  Last MMG (if hormonal medication request): 03-19-17 WNL  Refill authorized: please advise

## 2017-07-03 ENCOUNTER — Other Ambulatory Visit: Payer: Self-pay | Admitting: Obstetrics & Gynecology

## 2017-07-03 MED ORDER — ACYCLOVIR 5 % EX OINT: 1.0000 "application " | TOPICAL_OINTMENT | CUTANEOUS | 0 refills | Status: DC

## 2017-07-03 NOTE — Progress Notes (Signed)
Order for acyclovir ointment sent the pharmacy.

## 2017-07-03 NOTE — Telephone Encounter (Signed)
Spoke with patient and informed that RX has been sent in. eh

## 2017-07-03 NOTE — Telephone Encounter (Signed)
Rx has been done. Please let pt know that this is often not covered by insurance and many patient say it is expensive.  There is no alternative option to this.  Hopefully, this will not be the case for her.

## 2017-09-05 ENCOUNTER — Emergency Department (HOSPITAL_COMMUNITY)
Admission: EM | Admit: 2017-09-05 | Discharge: 2017-09-05 | Disposition: A | Discharge status: Home / Self Care | Payer: Self-pay | Attending: Emergency Medicine | Admitting: Emergency Medicine

## 2017-09-05 ENCOUNTER — Encounter (HOSPITAL_COMMUNITY): Payer: Self-pay | Admitting: Emergency Medicine

## 2017-09-05 DIAGNOSIS — M79605 Pain in left leg: Secondary | ICD-10-CM | POA: Insufficient documentation

## 2017-09-05 DIAGNOSIS — Z5321 Procedure and treatment not carried out due to patient leaving prior to being seen by health care provider: Secondary | ICD-10-CM | POA: Insufficient documentation

## 2017-09-05 NOTE — ED Triage Notes (Addendum)
Pt c/o L arm pain and swelling x several weeks and LLE pain, lateral lower extremity below knee. Pt states she was recently seen in [facility] clinic ER 08/22/2017 and patient had numbness and tingling to L arm. Pt states they did U/S of L arm and was negative for clots. Pt reports intermittent swelling to L arm. Pt states LLE pain developed at the same time but she did not get seen for LLE pain, denies swelling in LLE / foot. Radial pulses equal, no edema noted to LUE, no edema noted to LLE, pt's skin color WNL. Pt able to ambulate on LLE without difficulty.

## 2017-09-05 NOTE — ED Notes (Signed)
Pt stated she was going home

## 2017-09-05 NOTE — ED Notes (Signed)
No response when called from lobby. 

## 2017-09-05 NOTE — ED Notes (Signed)
Spoke with Dr. Alvino Chapel re: patients status and orders while waiting, Dr. Alvino Chapel stated no orders at this time. Charge nurse made aware of patient's status.

## 2017-10-14 ENCOUNTER — Encounter: Payer: Self-pay | Admitting: Vascular Surgery

## 2017-10-14 ENCOUNTER — Ambulatory Visit (INDEPENDENT_AMBULATORY_CARE_PROVIDER_SITE_OTHER): Payer: BLUE CROSS/BLUE SHIELD | Admitting: Vascular Surgery

## 2017-10-14 VITALS — BP 94/67 | HR 100 | Temp 97.2°F | Resp 18 | Ht 69.0 in | Wt 149.0 lb

## 2017-10-14 DIAGNOSIS — M25522 Pain in left elbow: Secondary | ICD-10-CM | POA: Diagnosis not present

## 2017-10-14 NOTE — Progress Notes (Signed)
Requested by:  Everardo Beals, NP [facility] [address] [place], [place] [address]  Reason for consultation: "thoracic outlet syndrome"    History of Present Illness   Nancy Glenn is a 56 y.o. (03-Aug-1961) female who presents with cc: left arm pain.  Pt was evaluated in an [place] [facility] on 08/22/17 for L arm pain that was described "shooting numbness" in L forearm radiating to L hand associated with singificant L hand swelling.  There was no obvious MOI though the patient does note some exercise in the days previous that might have contributed.  She has a history of lower back pain without any history of cervical neck pain.  The patient does noted at the same time she was have L lower leg pain also of similar character.  Per Pt's daughter her L hand was swollen with poorly palpable pulses and coolness to touch at the time of the event.  The patient had multiple studies done including a CTA and CTV L arm and L arm physiologic studies.  None of these studies demonstrated any obvious pathology.  While in the ED, a vascular surgeon was consulted that felt there was no obvious acute diagnosis and that TOS might be possible.  To date, this patient continues to have weakness with her L hand with L arm claudication equivalent sx.  She continues to have some pain with were forearm.  She does not have a pattern of classical Raynaud's skin changes.  Past Medical History:  Diagnosis Date  . 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 Anxiety   . Arthritis    lower back  . Depression   . History of palpitations   . HPV test positive   . HSV-2 infection   . Hx of migraines   . Hypothyroidism   . MVP (mitral valve prolapse)    a. With remote pregnancy per patient. b. 2D Echo 09/2014: normal EF, mild MR, trace pericardial effusion.  . Normal cardiac stress test    a. Normal ETT 10/2012.    Past Surgical History:  Procedure Laterality Date  . AUGMENTATION MAMMAPLASTY  Bilateral   . BREAST ENHANCEMENT SURGERY  1993  . BREAST EXCISIONAL BIOPSY Right   . OTHER SURGICAL HISTORY  2001   Tummy Tuck  . OTHER SURGICAL HISTORY  06/2007   Labial Reduction/ IUD insertion--removed within 1 month of placement  . periodontal  2006  . WISDOM TOOTH EXTRACTION       Social History   Socioeconomic History  . Marital status: Divorced    Spouse name: Not on file  . Number of children: Not on file  . Years of education: Not on file  . Highest education level: Not on file  Social Needs  . Financial resource strain: Not on file  . Food insecurity - worry: Not on file  . Food insecurity - inability: Not on file  . Transportation needs - medical: Not on file  . Transportation needs - non-medical: Not on file  Occupational History  . Not on file  Tobacco Use  . Smoking status: Never Smoker  . Smokeless tobacco: Never Used  Substance and Sexual Activity  . Alcohol use: No    Alcohol/week: 0.0 oz  . Drug use: No  . Sexual activity: No    Partners: Male    Birth control/protection: Post-menopausal  Other Topics Concern  . Not on file  Social History Narrative  . Not on file  Family History  Problem Relation Age of Onset  . Colon cancer Mother 75  . Osteoporosis Mother   . Thyroid disease Mother   . Depression Mother   . Osteoporosis Maternal Grandmother   . Diabetes Maternal Grandmother   . Hyperlipidemia Maternal Grandmother   . Hypertension Father   . Breast cancer Paternal Grandmother 27  . Anxiety disorder Neg Hx   . Alcohol abuse Neg Hx   . Bipolar disorder Neg Hx   . Drug abuse Neg Hx     Current Outpatient Medications  Medication Sig Dispense Refill  . ALPRAZolam (XANAX) 1 MG tablet Take 0.5 tablets (0.5 mg total) by mouth 3 (three) times daily as needed for anxiety. 90 tablet 1  . ARMOUR THYROID 15 MG tablet TAKE 1 TABLET (15 MG TOTAL) BY MOUTH DAILY. *PT NEEDS TO BE SEEN FOR FULL REFILL** 15 tablet 0  . ARMOUR THYROID 60 MG tablet TAKE  1 TABLET BY MOUTH EVERY DAY BEFORE BREAKFAST 15 tablet 0  . cetirizine (ZYRTEC) 10 MG tablet Take 10 mg by mouth daily.  6  . fluticasone (FLONASE) 50 MCG/ACT nasal spray Place 1 spray into both nostrils daily.  3  . Oxycodone HCl 10 MG TABS Take 1 tablet (10 mg total) by mouth 3 (three) times daily. 90 tablet 0  . traZODone (DESYREL) 100 MG tablet Take 25 mg by mouth at bedtime.   3  . valACYclovir (VALTREX) 1000 MG tablet Take 1 tablet (1,000 mg total) by mouth daily. 90 tablet 4  . Vitamin D, Ergocalciferol, (DRISDOL) 50000 units CAPS capsule Take 1 capsule (50,000 Units total) by mouth every 7 (seven) days. 12 capsule 4  . acyclovir ointment (ZOVIRAX) 5 % Apply 1 application topically every 3 (three) hours. (Patient not taking: Reported on 10/14/2017) 15 g 0  . meloxicam (MOBIC) 15 MG tablet Take 15 mg by mouth daily.  1  . MOVANTIK 25 MG TABS tablet Take 25 mg by mouth daily.  1  . VYVANSE 30 MG capsule Take 30 mg by mouth daily.  0   No current facility-administered medications for this visit.     Allergies  Allergen Reactions  . Sulfa Antibiotics Other (See Comments)    Migraines/swelling    REVIEW OF SYSTEMS (negative unless checked):   Cardiac:  []  Chest pain or chest pressure? []  Shortness of breath upon activity? []  Shortness of breath when lying flat? []  Irregular heart rhythm?  Vascular:  []  Pain in calf, thigh, or hip brought on by walking? []  Pain in feet at night that wakes you up from your sleep? []  Blood clot in your veins? []  Leg swelling? [x]  Left arm swelling?  Pulmonary:  []  Oxygen at home? []  Productive cough? []  Wheezing?  Neurologic:  [x]  Sudden weakness in arms or legs? []  Sudden numbness in arms or legs? []  Sudden onset of difficult speaking or slurred speech? []  Temporary loss of vision in one eye? []  Problems with dizziness?  Gastrointestinal:  []  Blood in stool? []  Vomited blood?  Genitourinary:  []  Burning when urinating? []  Blood  in urine?  Psychiatric:  []  Major depression  Hematologic:  []  Bleeding problems? []  Problems with blood clotting?  Dermatologic:  []  Rashes or ulcers?  Constitutional:  []  Fever or chills?  Ear/Nose/Throat:  []  Change in hearing? []  Nose bleeds? []  Sore throat?  Musculoskeletal:  []  Back pain? []  Joint pain? []  Muscle pain?   For VQI Use Only   PRE-ADM LIVING Home  AMB  STATUS Ambulatory  CAD Sx None  PRIOR CHF None  STRESS TEST No    Physical Examination     Vitals:   10/14/17 1241  BP: 94/67  Pulse: 100  Resp: 18  Temp: (!) 97.2 F (36.2 C)  TempSrc: Oral  SpO2: 96%  Weight: 149 lb (67.6 kg)  Height: 5\' 9"  (1.753 m)   Body mass index is 22 kg/m.  General Alert, O x 3, WD, NAD  Head [place]/AT,    Ear/Nose/ Throat Hearing grossly intact, nares without erythema or drainage, oropharynx without Erythema or Exudate, Mallampati score: 3,   Eyes PERRLA, EOMI,    Neck Supple, mid-line trachea,    Pulmonary Sym exp, good B air movt, CTA B  Cardiac RRR, Nl S1, S2, no Murmurs, No rubs, No S3,S4  Vascular Vessel Right Left  Radial Palpable Palpable (less than right)  Brachial Palpable Palpable  Carotid Palpable, No Bruit Palpable, No Bruit  Aorta Not palpable N/A  Femoral Palpable Palpable  Popliteal Not palpable Not palpable  PT Not palpable as wearing boot Not palpable as wearing boot  AT Palpable Palpable    Gastro- intestinal soft, non-distended, non-tender to palpation, No guarding or rebound, no HSM, no masses, no CVAT B, No palpable prominent aortic pulse, Surgical incisions well healed  Musculo- skeletal M/S 5/5 throughout  , Extremities without ischemic changes  , No edema present, No visible varicosities , No Lipodermatosclerosis present  Neurologic Cranial nerves 2-12 intact , Pain and light touch intact in extremities , Motor exam as listed above  Psychiatric Judgement intact, Mood & affect appropriate for pt's clinical situation  Dermatologic  See M/S exam for extremity exam, No rashes otherwise noted  Lymphatic  Palpable lymph nodes: None    Radiology     Outside L arm CTA (08/22/17): negative CTA of left upper extremity.  No evidence of focal stenosis or thrombosis.  Outside L venous duplex (08/22/17): no DVT  Outside L arm physiologic (08/22/17): normal   Outside Studies/Documentation   20 pages of outside documents were reviewed including: including [facility] ED report, Vascular Surgery's note, and ED studies.   Medical Decision Making   Arielys Wandersee is a 56 y.o. female who presents with: Left arm pain and swelling.   In general, TOS is a clinical diagnosis of exclusion, so jumping straight to TOS as the diagnosis is not appropriate.  Most of the work-up in this patient has NOT been done.  In regards to TOS in this patient...  Arterial TOS frequently results in subclavian aneurysm, which this patient does NOT have.  Venous TOS results in thrombosis of SCV and axillary vein, which this patient patient does NOT have.  Neurogenic TOS does NOT result in swelling.  Pt doesn't really have the MOI consistent with such and her sx I don't think fit nTOS.  I doubt this patient has Raynaud's disease as it doesn't cause swelling.  Regardless, this patient is having some functional loss of the L arm so further work-up is indicated.  CXR to evaluate for cervical ribs  Ortho c/s for evaluation of mechanical etiologies for her forearm sx  Neurosurg c/s for a possible spinal/nerve compression etiology for her sx  Would start with this work-up.  If this pans out, will refer her for further work-up with Dr. Elenor Quinones [facility]) as no one locally performs routinely first rib resections.  Thank you for allowing [place] to participate in this patient's care.   Adele Barthel, MD, FACS [facility] Office: [phone] Pager: [phone]  10/14/2017, 2:10 PM

## 2017-10-15 ENCOUNTER — Other Ambulatory Visit: Payer: Self-pay

## 2017-10-15 ENCOUNTER — Encounter: Payer: Self-pay | Admitting: Vascular Surgery

## 2017-11-08 ENCOUNTER — Encounter: Payer: Self-pay | Admitting: Endocrinology

## 2017-11-14 ENCOUNTER — Telehealth: Payer: Self-pay | Admitting: Endocrinology

## 2017-11-14 NOTE — Telephone Encounter (Signed)
Patient dismissed from [facility] by Elayne Snare MD , effective November 08, 2017. Dismissal letter sent out by certified / registered mail.  daj

## 2017-11-15 ENCOUNTER — Other Ambulatory Visit: Payer: Self-pay | Admitting: *Deleted

## 2017-11-15 MED ORDER — VITAMIN D (ERGOCALCIFEROL) 1.25 MG (50000 UNIT) PO CAPS: 50000.0000 [IU] | ORAL_CAPSULE | ORAL | 4 refills | Status: DC

## 2017-11-15 NOTE — Telephone Encounter (Signed)
Medication refill request: vitamin d  Last AEX:  12/13/16  Next AEX: 03/14/18  Last MMG (if hormonal medication request): 03/19/17 BIRADS 1 negative  Refill authorized: 1/11/8 #12, 4 RF. Today, please advise.

## 2017-12-24 NOTE — Telephone Encounter (Signed)
Certified dismissal letter returned as undeliverable, unclaimed, return to sender after three attempts by [employer]PS on December 24, 2017. Letter placed in another envelope and resent as 1st class mail which does not require a signature. daj

## 2018-01-11 ENCOUNTER — Other Ambulatory Visit: Payer: Self-pay | Admitting: Obstetrics & Gynecology

## 2018-01-13 NOTE — Telephone Encounter (Signed)
Medication refill request: Valtrex  Last AEX:  12-13-16  Next AEX: 03-14-18  Last MMG (if hormonal medication request): 03-18-17 WNL  Refill authorized: please advise

## 2018-02-05 DIAGNOSIS — E039 Hypothyroidism, unspecified: Secondary | ICD-10-CM | POA: Insufficient documentation

## 2018-03-14 ENCOUNTER — Ambulatory Visit: Payer: BLUE CROSS/BLUE SHIELD | Admitting: Obstetrics & Gynecology

## 2018-03-14 ENCOUNTER — Encounter: Payer: Self-pay | Admitting: Obstetrics & Gynecology

## 2018-03-14 ENCOUNTER — Other Ambulatory Visit: Payer: Self-pay

## 2018-03-14 ENCOUNTER — Other Ambulatory Visit (HOSPITAL_COMMUNITY)
Admission: RE | Admit: 2018-03-14 | Discharge: 2018-03-14 | Disposition: A | Discharge status: Home / Self Care | Payer: BLUE CROSS/BLUE SHIELD | Source: Ambulatory Visit | Attending: Obstetrics & Gynecology | Admitting: Obstetrics & Gynecology

## 2018-03-14 VITALS — BP 98/64 | HR 68 | Resp 16 | Ht 68.75 in | Wt 148.0 lb

## 2018-03-14 DIAGNOSIS — E039 Hypothyroidism, unspecified: Secondary | ICD-10-CM | POA: Diagnosis not present

## 2018-03-14 DIAGNOSIS — Z124 Encounter for screening for malignant neoplasm of cervix: Secondary | ICD-10-CM

## 2018-03-14 DIAGNOSIS — R748 Abnormal levels of other serum enzymes: Secondary | ICD-10-CM

## 2018-03-14 DIAGNOSIS — Z01419 Encounter for gynecological examination (general) (routine) without abnormal findings: Secondary | ICD-10-CM

## 2018-03-14 DIAGNOSIS — R29898 Other symptoms and signs involving the musculoskeletal system: Secondary | ICD-10-CM | POA: Diagnosis not present

## 2018-03-14 DIAGNOSIS — F329 Major depressive disorder, single episode, unspecified: Secondary | ICD-10-CM | POA: Diagnosis not present

## 2018-03-14 DIAGNOSIS — N870 Mild cervical dysplasia: Secondary | ICD-10-CM

## 2018-03-14 DIAGNOSIS — M546 Pain in thoracic spine: Secondary | ICD-10-CM

## 2018-03-14 DIAGNOSIS — F419 Anxiety disorder, unspecified: Secondary | ICD-10-CM | POA: Insufficient documentation

## 2018-03-14 DIAGNOSIS — Z0000 Encounter for general adult medical examination without abnormal findings: Secondary | ICD-10-CM

## 2018-03-14 DIAGNOSIS — G43909 Migraine, unspecified, not intractable, without status migrainosus: Secondary | ICD-10-CM | POA: Diagnosis not present

## 2018-03-14 DIAGNOSIS — G8929 Other chronic pain: Secondary | ICD-10-CM

## 2018-03-14 MED ORDER — VALACYCLOVIR HCL 1 G PO TABS: 1000.0000 mg | ORAL_TABLET | Freq: Every day | ORAL | 4 refills | Status: DC

## 2018-03-14 MED ORDER — VITAMIN D (ERGOCALCIFEROL) 1.25 MG (50000 UNIT) PO CAPS: 50000.0000 [IU] | ORAL_CAPSULE | ORAL | 4 refills | Status: DC

## 2018-03-14 MED ORDER — RIZATRIPTAN BENZOATE 10 MG PO TBDP: 10.0000 mg | ORAL_TABLET | Freq: Every day | ORAL | 12 refills | Status: AC | PRN

## 2018-03-14 MED ORDER — ACYCLOVIR 5 % EX OINT: 1.0000 "application " | TOPICAL_OINTMENT | CUTANEOUS | 2 refills | Status: AC

## 2018-03-14 NOTE — Progress Notes (Signed)
57 y.o. [id number] Divorced CaucasianF here for annual exam.  Denies vaginal bleeding.  Having chronic pain issues.  Was seeing Pain MD in [facility].  Was having to pay for these appt.  Saw a provider at [facility] which was not a good visit.  States pain medication was helping her function more normally.  Now that is not the case.    Has house up for sale.  Not sure what she is doing after she sells her home.  Considering moving to the [place] area to be closer to daughter who is a PA.  Daughter had surgery done last year.  Pt and her daughter were in [place] for about a month.    Pt went through [place] treatment last year.    Having some left arm weakness.  Did go to ER in [place] when symptoms started.  She did have ultrasound to evaluate for clot.  This was negative.  Needs to have MRI of left arm.  Was advised to have this scheduled once returned home.  Has not done this yet.    Denies vaginal bleeding.  Patient's last menstrual period was 03/03/2014.          Sexually active: No.  The current method of family planning is post menopausal status.    Exercising: Yes.    walking Smoker:  no  Health Maintenance: Pap:  12/13/16 neg. HR HPV:neg   10/24/15 ASCUS. HR HPV:+detected. Gade 1 dysplasia  History of abnormal Pap:  Yes MMG:  03/19/17 BIRADS1:Neg  Colonoscopy:  09/19/16 f/u 5 years  BMD:   11/27/16 Osteopenia  TDaP:  2014 Pneumonia vaccine(s):  2014 Shingrix:  Zostavax 2015 Hep C testing: 10/24/15 Neg  Screening Labs: Here today   reports that she has never smoked. She has never used smokeless tobacco. She reports that she does not drink alcohol or use drugs.  Past Medical History:  Diagnosis Date  . 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 Anxiety   . Arthritis    lower back  . Depression   . History of palpitations   . HPV test positive   . HSV-2 infection   . Hx of migraines   . Hypothyroidism   . MVP (mitral valve prolapse)    a. With remote pregnancy per  patient. b. 2D Echo 09/2014: normal EF, mild MR, trace pericardial effusion.  . Normal cardiac stress test    a. Normal ETT 10/2012.    Past Surgical History:  Procedure Laterality Date  . AUGMENTATION MAMMAPLASTY Bilateral   . BREAST ENHANCEMENT SURGERY  1993  . BREAST EXCISIONAL BIOPSY Right   . OTHER SURGICAL HISTORY  2001   Tummy Tuck  . OTHER SURGICAL HISTORY  06/2007   Labial Reduction/ IUD insertion--removed within 1 month of placement  . periodontal  2006  . WISDOM TOOTH EXTRACTION      Current Outpatient Medications  Medication Sig Dispense Refill  . acyclovir ointment (ZOVIRAX) 5 % Apply 1 application topically every 3 (three) hours. 15 g 0  . ALPRAZolam (XANAX) 1 MG tablet Take 0.5 tablets (0.5 mg total) by mouth 3 (three) times daily as needed for anxiety. 90 tablet 1  . amphetamine-dextroamphetamine (ADDERALL) 20 MG tablet Take 1 tablet by mouth daily as needed.    . cetirizine (ZYRTEC) 10 MG tablet Take 10 mg by mouth daily.  6  . clindamycin-tretinoin (ZIANA) gel daily as needed.    . fluticasone (FLONASE) 50 MCG/ACT nasal spray Place 1  spray into both nostrils daily.  3  . gabapentin (NEURONTIN) 400 MG capsule Take 1 capsule by mouth daily.    . meloxicam (MOBIC) 15 MG tablet Take 15 mg by mouth daily.  1  . metaxalone (SKELAXIN) 800 MG tablet daily.    . Olopatadine HCl (PAZEO) 0.7 % SOLN daily as needed.    . Oxycodone HCl 10 MG TABS Take 1 tablet (10 mg total) by mouth 3 (three) times daily. 90 tablet 0  . rizatriptan (MAXALT-MLT) 10 MG disintegrating tablet Take 1 tablet by mouth daily as needed.    . Thyroid (LEVOTHYROXINE-LIOTHYRONINE) 90 MG TABS Take 1 tablet by mouth daily.    . traZODone (DESYREL) 100 MG tablet Take 25 mg by mouth at bedtime.   3  . valACYclovir (VALTREX) 1000 MG tablet TAKE 1 TABLET (1,000 MG TOTAL) BY MOUTH DAILY. 90 tablet 1  . Vitamin D, Ergocalciferol, (DRISDOL) 50000 units CAPS capsule Take 1 capsule (50,000 Units total) by mouth every  7 (seven) days. 12 capsule 4  . vortioxetine HBr (TRINTELLIX) 20 MG TABS tablet Take 1 tablet by mouth daily.     No current facility-administered medications for this visit.     Family History  Problem Relation Age of Onset  . Colon cancer Mother 43  . Osteoporosis Mother   . Thyroid disease Mother   . Depression Mother   . Osteoporosis Maternal Grandmother   . Diabetes Maternal Grandmother   . Hyperlipidemia Maternal Grandmother   . Hypertension Father   . Breast cancer Paternal Grandmother 84  . Anxiety disorder Neg Hx   . Alcohol abuse Neg Hx   . Bipolar disorder Neg Hx   . Drug abuse Neg Hx     Review of Systems  All other systems reviewed and are negative.   Exam:   BP 98/64   Pulse 68   Resp 16   Ht 5' 8.75" (1.746 m)   Wt 148 lb (67.1 kg)   LMP 03/03/2014   BMI 22.02 kg/m   Height:   Height: 5' 8.75" (174.6 cm)  Ht Readings from Last 3 Encounters:  03/14/18 5' 8.75" (1.746 m)  10/14/17 5\' 9"  (1.753 m)  12/13/16 5\' 9"  (1.753 m)    General appearance: alert, cooperative and appears stated age Head: Normocephalic, without obvious abnormality, atraumatic Neck: no adenopathy, supple, symmetrical, trachea midline and thyroid normal to inspection and palpation Lungs: clear to auscultation bilaterally Breasts: normal appearance, no masses or tenderness Heart: regular rate and rhythm Abdomen: soft, non-tender; bowel sounds normal; no masses,  no organomegaly Extremities: extremities normal, atraumatic, no cyanosis or edema Skin: Skin color, texture, turgor normal. No rashes or lesions Lymph nodes: Cervical, supraclavicular, and axillary nodes normal. No abnormal inguinal nodes palpated Neurologic: Grossly normal   Pelvic: External genitalia:  no lesions              Urethra:  normal appearing urethra with no masses, tenderness or lesions              Bartholins and Skenes: normal                 Vagina: normal appearing vagina with normal color and discharge,  no lesions              Cervix: no lesions              Pap taken: No. Bimanual Exam:  Uterus:  normal size, contour, position, consistency, mobility, non-tender  Adnexa: normal adnexa and no mass, fullness, tenderness               Rectovaginal: Confirms               Anus:  normal sphincter tone, no lesions  Chaperone was present for exam.  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  Pap and HR HPV obtained today as well for follow up Needs ortho referral to left arm weakness Pain management referral Lab work obtained today:  TSH, Free T4, Free t3, Lipids, CMP, CBC Zovirax ointment 5% to use every 3 hour prn symptoms Valtrex 1000mg  daily, increasing to BID x 3 days with symptoms maxalt 10mg  po x 1 with HA onset, repeat 2 hours.  Max 200mg /24 hours.  #9/12 RF Vit d 50K weekly.  #12/4Rf Return yearly or prn

## 2018-03-15 LAB — COMPREHENSIVE METABOLIC PANEL
ALT: 33 IU/L — ABNORMAL HIGH (ref 0–32)
AST: 19 IU/L (ref 0–40)
Albumin/Globulin Ratio: 1.8 (ref 1.2–2.2)
Albumin: 4.4 g/dL (ref 3.5–5.5)
Alkaline Phosphatase: 72 IU/L (ref 39–117)
BUN/Creatinine Ratio: 24 — ABNORMAL HIGH (ref 9–23)
BUN: 17 mg/dL (ref 6–24)
Bilirubin Total: 0.3 mg/dL (ref 0.0–1.2)
CALCIUM: 9.5 mg/dL (ref 8.7–10.2)
CO2: 26 mmol/L (ref 20–29)
Chloride: 100 mmol/L (ref 96–106)
Creatinine, Ser: 0.72 mg/dL (ref 0.57–1.00)
GFR calc Af Amer: 108 mL/min/{1.73_m2} (ref >59)
GFR, EST NON AFRICAN AMERICAN: 94 mL/min/{1.73_m2} (ref >59)
Globulin, Total: 2.4 g/dL (ref 1.5–4.5)
Glucose: 88 mg/dL (ref 65–99)
POTASSIUM: 4.9 mmol/L (ref 3.5–5.2)
Sodium: 140 mmol/L (ref 134–144)
TOTAL PROTEIN: 6.8 g/dL (ref 6.0–8.5)

## 2018-03-15 LAB — LIPID PANEL
CHOL/HDL RATIO: 3.1 ratio (ref 0.0–4.4)
Cholesterol, Total: 222 mg/dL — ABNORMAL HIGH (ref 100–199)
HDL: 72 mg/dL (ref >39)
LDL Calculated: 116 mg/dL — ABNORMAL HIGH (ref 0–99)
TRIGLYCERIDES: 169 mg/dL — AB (ref 0–149)
VLDL CHOLESTEROL CAL: 34 mg/dL (ref 5–40)

## 2018-03-15 LAB — TSH: TSH: 0.029 u[IU]/mL — AB (ref 0.450–4.500)

## 2018-03-15 LAB — CBC
Hematocrit: 41.5 % (ref 34.0–46.6)
Hemoglobin: 14.2 g/dL (ref 11.1–15.9)
MCH: 34 pg — ABNORMAL HIGH (ref 26.6–33.0)
MCHC: 34.2 g/dL (ref 31.5–35.7)
MCV: 99 fL — ABNORMAL HIGH (ref 79–97)
PLATELETS: 242 10*3/uL (ref 150–379)
RBC: 4.18 x10E6/uL (ref 3.77–5.28)
RDW: 13.1 % (ref 12.3–15.4)
WBC: 5.5 10*3/uL (ref 3.4–10.8)

## 2018-03-15 LAB — T4, FREE: FREE T4: 0.79 ng/dL — AB (ref 0.82–1.77)

## 2018-03-15 LAB — VITAMIN D 25 HYDROXY (VIT D DEFICIENCY, FRACTURES): Vit D, 25-Hydroxy: 39.9 ng/mL (ref 30.0–100.0)

## 2018-03-15 LAB — T3, FREE: T3, Free: 5 pg/mL — ABNORMAL HIGH (ref 2.0–4.4)

## 2018-03-18 LAB — CYTOLOGY - PAP
DIAGNOSIS: NEGATIVE
HPV: NOT DETECTED

## 2018-03-19 ENCOUNTER — Telehealth: Payer: Self-pay | Admitting: Obstetrics & Gynecology

## 2018-03-19 NOTE — Telephone Encounter (Signed)
Left voicemail regarding referral appointment. The information is listed below. Should the patient need to cancel or reschedule this appointment, Please advise them to call the office they've been referred to in order to reschedule.  [facility] [address] [place] [id number] [phone]  Dr. Stann Mainland 03/25/18 @ 2:30 pm. Please arrive 15 minutes early and bring your insurance card and photo id and list of medications

## 2018-03-19 NOTE — Telephone Encounter (Signed)
Routing to [place] for review and advise of results from 03/14/2018.

## 2018-03-19 NOTE — Telephone Encounter (Signed)
Patient calling for test results from 03/14/18.

## 2018-03-20 ENCOUNTER — Telehealth: Payer: Self-pay | Admitting: Obstetrics & Gynecology

## 2018-03-20 NOTE — Telephone Encounter (Signed)
Patient calling for results. Would like them to go to Dr Posey Pronto an endocrinologist over at [facility].

## 2018-03-20 NOTE — Telephone Encounter (Signed)
No message needed °

## 2018-03-20 NOTE — Addendum Note (Signed)
Addended by: Megan Salon on: 03/20/2018 05:51 PM   Modules accepted: Orders

## 2018-03-20 NOTE — Telephone Encounter (Signed)
I sent pt results via Epic.  Needs liver panel in a month but routed that to [place] to call.  Please send thyroid lab work to [facility], Dr. Posey Pronto, as directed by pt.  Thanks.

## 2018-03-20 NOTE — Telephone Encounter (Signed)
Routing to [place] for review of results.

## 2018-03-24 NOTE — Telephone Encounter (Signed)
Results sent to Dr.Dhaval Patel. Encounter closed.

## 2018-03-31 ENCOUNTER — Telehealth: Payer: Self-pay | Admitting: Obstetrics & Gynecology

## 2018-03-31 NOTE — Telephone Encounter (Signed)
Left detailed message, ok per dpr. Advised referral has been placed for pain management. Our referral coordinator is out of the office, will review on 4/30 and return call with update. Return call to office with any additional questions.  Rosa, can you follow-up with referral for pain management?

## 2018-03-31 NOTE — Telephone Encounter (Signed)
Patient says she does not have information about a pain management referral that was supposed to be put in.

## 2018-04-01 NOTE — Telephone Encounter (Signed)
I have spoken with the patient in regards to the referral. She is aware of the 4-6 weeks it will take for reviewing and scheduling.

## 2018-04-01 NOTE — Telephone Encounter (Signed)
Routing to Dr. Sabra Heck, will close encounter.   Cc: Nancy Glenn

## 2018-04-08 ENCOUNTER — Telehealth: Payer: Self-pay | Admitting: Obstetrics & Gynecology

## 2018-04-08 NOTE — Telephone Encounter (Signed)
Per documentation from pain management  they have received the referral. It takes 4-6 weeks to review and schedule. Patient is aware of details concerning this referral.

## 2018-04-08 NOTE — Telephone Encounter (Signed)
Labs faxed to Dr.Patel at [phone]. Confirmation received.

## 2018-04-08 NOTE — Telephone Encounter (Signed)
Patient called to check status of referral to main management doctor. Also called to request that most recent thyroid results be sent to Dr. Posey Pronto.   Routing to [employer]  (Referral Department) to check on status of referral and to triage for lab result request.

## 2018-04-08 NOTE — Telephone Encounter (Signed)
Notified patient of labs faxed to Dr.Patel and referral status. Patient verbalizes understanding.  Routing to provider for final review. Patient agreeable to disposition. Will close encounter.

## 2018-04-16 ENCOUNTER — Telehealth: Payer: Self-pay

## 2018-04-16 NOTE — Telephone Encounter (Signed)
Left message to call Doree Kuehne at [phone]. 

## 2018-04-16 NOTE — Telephone Encounter (Signed)
-----   Message from Megan Salon, MD sent at 04/15/2018  5:20 PM EDT ----- Regarding: FW: declined referral This came from the office for the pain management referral.  Can you relay this to her?  It's very helpful to know every office she's ever been to for pain management.  Then maybe we can find one that will accept her referral.  If she could provide that, then I can see where else she can be referred.  Thanks.  Vinnie Level ----- Message ----- From: Roetta Sessions Sent: 04/09/2018   1:32 PM To: Megan Salon, MD Subject: declined referral                              Thank you for the referral but this patient did not agree with MD's opinion asked for a 2nd and left our clinic in the past - not eligible for clinic.

## 2018-04-22 ENCOUNTER — Other Ambulatory Visit: Payer: BLUE CROSS/BLUE SHIELD

## 2018-04-22 ENCOUNTER — Telehealth: Payer: Self-pay | Admitting: Obstetrics & Gynecology

## 2018-04-22 NOTE — Telephone Encounter (Signed)
Note not needed 

## 2018-04-29 ENCOUNTER — Other Ambulatory Visit (INDEPENDENT_AMBULATORY_CARE_PROVIDER_SITE_OTHER): Payer: BLUE CROSS/BLUE SHIELD

## 2018-04-29 DIAGNOSIS — R748 Abnormal levels of other serum enzymes: Secondary | ICD-10-CM

## 2018-04-30 LAB — HEPATIC FUNCTION PANEL
ALBUMIN: 4.5 g/dL (ref 3.5–5.5)
ALT: 15 IU/L (ref 0–32)
AST: 15 IU/L (ref 0–40)
Alkaline Phosphatase: 61 IU/L (ref 39–117)
BILIRUBIN TOTAL: 0.5 mg/dL (ref 0.0–1.2)
BILIRUBIN, DIRECT: 0.15 mg/dL (ref 0.00–0.40)
Total Protein: 6.8 g/dL (ref 6.0–8.5)

## 2018-05-01 NOTE — Telephone Encounter (Signed)
Referral has been made to [facility]. Patient has not been seen at that location. MD is reviewing patient's notes before scheduling. Magdalene Patricia in referrals is working on this with their office. Encounter closed.

## 2018-05-13 ENCOUNTER — Telehealth: Payer: Self-pay | Admitting: Obstetrics & Gynecology

## 2018-05-13 NOTE — Telephone Encounter (Signed)
Dr. Sabra Heck,   This is to notify you that your patient, Nancy Glenn is scheduled with Dr. Hardin Negus at [facility] on 07/16/18.  Thank you,   Magdalene Patricia

## 2018-05-19 LAB — LIPID PANEL W/O CHOL/HDL RATIO
CHOLESTEROL TOTAL: 232 mg/dL — AB (ref 100–199)
HDL: 75 mg/dL (ref >39)
LDL CALC: 132 mg/dL — AB (ref 0–99)
TRIGLYCERIDES: 127 mg/dL (ref 0–149)
VLDL CHOLESTEROL CAL: 25 mg/dL (ref 5–40)

## 2018-05-19 LAB — SPECIMEN STATUS REPORT

## 2018-05-19 LAB — TSH

## 2018-05-19 LAB — T3, FREE

## 2018-05-19 LAB — T4, FREE

## 2018-12-03 ENCOUNTER — Encounter: Payer: Self-pay | Admitting: Obstetrics & Gynecology

## 2018-12-04 ENCOUNTER — Encounter: Payer: Self-pay | Admitting: Obstetrics & Gynecology

## 2019-03-29 ENCOUNTER — Other Ambulatory Visit: Payer: Self-pay | Admitting: Obstetrics & Gynecology

## 2019-03-30 NOTE — Telephone Encounter (Signed)
Medication refill request: Vit D and Valtrex  Last AEX:  03/14/18 SM Next AEX: 05/29/19  Last MMG (if hormonal medication request): 12/17/18 BIRADS2:Benign  Refill authorized: vit D 03/14/18 #12caps/4R. Valtrex 03/14/18 #90tabs/4R. Today please advise.

## 2019-05-29 ENCOUNTER — Encounter

## 2019-05-29 ENCOUNTER — Ambulatory Visit: Payer: BLUE CROSS/BLUE SHIELD | Admitting: Obstetrics & Gynecology

## 2019-07-08 ENCOUNTER — Telehealth: Payer: Self-pay | Admitting: *Deleted

## 2019-07-08 DIAGNOSIS — R87810 Cervical high risk human papillomavirus (HPV) DNA test positive: Secondary | ICD-10-CM

## 2019-07-08 DIAGNOSIS — R87610 Atypical squamous cells of undetermined significance on cytologic smear of cervix (ASC-US): Secondary | ICD-10-CM

## 2019-07-08 NOTE — Telephone Encounter (Signed)
Appointment Request From: Nancy Glenn    With Provider: Megan Salon, MD [facility]]    Preferred Date Range: Any date 07/09/2019 or later    Preferred Times: Any Time    Reason for visit: Request an Appointment    Comments:  I got a positive HPV back . I need to get a colposcopy . I was seen in [place] where I am staying with my daughter for awhile but want to see Dr. Migdalia Dk for this.

## 2019-07-09 NOTE — Telephone Encounter (Signed)
Last AEX with Dr. Sabra Heck 03/14/18  See [facility] pap results in [place] dated 06/24/19. ASCUS pap, positive hpv; neg 16/18/45  Routing to Dr. Sabra Heck to review and advise on scheduling colpo.

## 2019-07-10 NOTE — Telephone Encounter (Signed)
OK to proceed with scheduling.  Order placed and signed.

## 2019-07-10 NOTE — Telephone Encounter (Signed)
Left message to call Sharee Pimple, RN at [facility].  Call placed to home number, number has been disconnected.

## 2019-07-13 NOTE — Telephone Encounter (Signed)
Spoke with patient, advised per Dr. Sabra Heck. Patient is menopausal. Colpo scheduled for 8/26 at 11am. Advised to take Motrin 800 mg with food and water one hour before procedure. Patient verbalizes understanding and is agreeable.   Routing to [employer] for [employer].

## 2019-07-22 ENCOUNTER — Telehealth: Payer: Self-pay | Admitting: *Deleted

## 2019-07-22 NOTE — Telephone Encounter (Signed)
Call to patient at [phone], listed on DPR. Voice mail states number is disconnected. Call to alternate number- no answer and no voice mail.  Patient has phone screen noted in [place] for upcoming appointment with [facility]. Pap was done at that office.   Routing to provider. Please advise on follow-up.

## 2019-07-22 NOTE — Telephone Encounter (Signed)
Patient cancelled colposcopy scheduled 07/29/19 and does not wish to reschedule at this time.

## 2019-07-23 NOTE — Telephone Encounter (Signed)
This is fine.  Makes sense for her to do this all with the same provider.  Ok to close encounter.  No recall needed.

## 2019-07-29 ENCOUNTER — Ambulatory Visit: Payer: Self-pay | Admitting: Obstetrics & Gynecology

## 2019-09-30 ENCOUNTER — Other Ambulatory Visit: Payer: Self-pay

## 2019-09-30 NOTE — Telephone Encounter (Signed)
Medication refill request: Valacyclovir Last AEX:  03/14/18 SM Next AEX: none scheduled  Last MMG (if hormonal medication request): 12/17/18 BIRADS 2 benign Refill authorized: Please advise; order pended #90 w/0 refills if authorized

## 2019-10-01 ENCOUNTER — Other Ambulatory Visit: Payer: Self-pay

## 2019-10-01 NOTE — Telephone Encounter (Signed)
Medication refill request: Vitamin D Last AEX: 11/2018 SM Next AEX: not scheduled Last MMG (if hormonal medication request): 12/17/2018 BIRADS 2 Benign Refill authorized: Pending #12 with no refills if appropriate. Please advise.

## 2019-10-01 NOTE — Telephone Encounter (Signed)
Pt is seeing Dr. Broadus John with the [facility] system.  This is the second request for a medication that her new ob/gyn should prescribe.  Already sent message to Terence Lux to call pt and ask pharmacy to transfer refill request to new ob/gyn.  Rx declined.

## 2019-10-01 NOTE — Telephone Encounter (Signed)
Patient notified

## 2019-10-01 NOTE — Telephone Encounter (Signed)
Please call pt.  She's seeing a provider in the [place] area I believe.  Ask her to please call the pharmacy and have this prescription request sent to her new provider.  Thanks.  Rx declined.

## 2020-11-08 ENCOUNTER — Ambulatory Visit: Payer: BLUE CROSS/BLUE SHIELD

## 2021-06-07 ENCOUNTER — Encounter (HOSPITAL_BASED_OUTPATIENT_CLINIC_OR_DEPARTMENT_OTHER): Payer: Self-pay | Admitting: Obstetrics & Gynecology

## 2021-07-10 ENCOUNTER — Ambulatory Visit (HOSPITAL_BASED_OUTPATIENT_CLINIC_OR_DEPARTMENT_OTHER): Payer: Self-pay | Admitting: Obstetrics & Gynecology

## 2021-07-19 ENCOUNTER — Other Ambulatory Visit: Payer: Self-pay

## 2021-07-19 ENCOUNTER — Encounter (HOSPITAL_BASED_OUTPATIENT_CLINIC_OR_DEPARTMENT_OTHER): Payer: Self-pay

## 2021-07-19 ENCOUNTER — Ambulatory Visit (HOSPITAL_BASED_OUTPATIENT_CLINIC_OR_DEPARTMENT_OTHER): Payer: BLUE CROSS/BLUE SHIELD | Admitting: Obstetrics & Gynecology

## 2021-10-31 ENCOUNTER — Encounter: Payer: Self-pay | Admitting: Internal Medicine
